# Patient Record
Sex: Female | Born: 1960 | Race: White | Hispanic: No | Marital: Married | Smoking: Former smoker
Health system: Southern US, Community
[De-identification: ages and names within clinical notes are randomized; demographics above are authoritative.]

## PROBLEM LIST (undated history)

## (undated) DIAGNOSIS — E669 Obesity, unspecified: Secondary | ICD-10-CM

## (undated) DIAGNOSIS — E079 Disorder of thyroid, unspecified: Secondary | ICD-10-CM

## (undated) DIAGNOSIS — D219 Benign neoplasm of connective and other soft tissue, unspecified: Secondary | ICD-10-CM

## (undated) DIAGNOSIS — G43909 Migraine, unspecified, not intractable, without status migrainosus: Secondary | ICD-10-CM

## (undated) DIAGNOSIS — N809 Endometriosis, unspecified: Secondary | ICD-10-CM

## (undated) DIAGNOSIS — M1990 Unspecified osteoarthritis, unspecified site: Secondary | ICD-10-CM

## (undated) DIAGNOSIS — E785 Hyperlipidemia, unspecified: Secondary | ICD-10-CM

## (undated) HISTORY — DX: Benign neoplasm of connective and other soft tissue, unspecified: D21.9

## (undated) HISTORY — DX: Disorder of thyroid, unspecified: E07.9

## (undated) HISTORY — DX: Endometriosis, unspecified: N80.9

## (undated) HISTORY — PX: TOTAL KNEE ARTHROPLASTY: SHX125

## (undated) HISTORY — PX: OTHER SURGICAL HISTORY: SHX169

## (undated) HISTORY — DX: Obesity, unspecified: E66.9

## (undated) HISTORY — DX: Hyperlipidemia, unspecified: E78.5

## (undated) HISTORY — DX: Migraine, unspecified, not intractable, without status migrainosus: G43.909

## (undated) HISTORY — DX: Unspecified osteoarthritis, unspecified site: M19.90

---

## 1999-03-25 ENCOUNTER — Other Ambulatory Visit: Admission: RE | Admit: 1999-03-25 | Discharge: 1999-03-25 | Payer: Self-pay | Admitting: Obstetrics and Gynecology

## 1999-09-07 ENCOUNTER — Emergency Department (HOSPITAL_COMMUNITY): Admission: EM | Admit: 1999-09-07 | Discharge: 1999-09-07 | Payer: Self-pay | Admitting: Emergency Medicine

## 1999-09-07 ENCOUNTER — Encounter: Payer: Self-pay | Admitting: Emergency Medicine

## 1999-09-13 ENCOUNTER — Ambulatory Visit (HOSPITAL_BASED_OUTPATIENT_CLINIC_OR_DEPARTMENT_OTHER): Admission: RE | Admit: 1999-09-13 | Discharge: 1999-09-13 | Payer: Self-pay | Admitting: Otolaryngology

## 2000-03-20 ENCOUNTER — Other Ambulatory Visit: Admission: RE | Admit: 2000-03-20 | Discharge: 2000-03-20 | Payer: Self-pay | Admitting: Obstetrics and Gynecology

## 2003-02-05 ENCOUNTER — Other Ambulatory Visit: Admission: RE | Admit: 2003-02-05 | Discharge: 2003-02-05 | Payer: Self-pay | Admitting: Obstetrics and Gynecology

## 2004-04-28 ENCOUNTER — Other Ambulatory Visit: Admission: RE | Admit: 2004-04-28 | Discharge: 2004-04-28 | Payer: Self-pay | Admitting: Obstetrics and Gynecology

## 2005-05-24 ENCOUNTER — Other Ambulatory Visit: Admission: RE | Admit: 2005-05-24 | Discharge: 2005-05-24 | Payer: Self-pay | Admitting: Obstetrics and Gynecology

## 2006-06-27 ENCOUNTER — Encounter: Admission: RE | Admit: 2006-06-27 | Discharge: 2006-06-27 | Payer: Self-pay | Admitting: Obstetrics and Gynecology

## 2006-07-31 HISTORY — PX: ENDOMETRIAL ABLATION: SHX621

## 2007-01-25 ENCOUNTER — Encounter: Admission: RE | Admit: 2007-01-25 | Discharge: 2007-01-25 | Payer: Self-pay | Admitting: Obstetrics and Gynecology

## 2007-06-14 ENCOUNTER — Encounter (HOSPITAL_COMMUNITY): Payer: Self-pay | Admitting: Obstetrics and Gynecology

## 2007-06-14 ENCOUNTER — Ambulatory Visit (HOSPITAL_COMMUNITY): Admission: RE | Admit: 2007-06-14 | Discharge: 2007-06-14 | Payer: Self-pay | Admitting: Obstetrics and Gynecology

## 2007-07-02 ENCOUNTER — Encounter: Admission: RE | Admit: 2007-07-02 | Discharge: 2007-07-02 | Payer: Self-pay | Admitting: Obstetrics and Gynecology

## 2007-12-18 ENCOUNTER — Encounter: Admission: RE | Admit: 2007-12-18 | Discharge: 2007-12-18 | Payer: Self-pay | Admitting: Internal Medicine

## 2008-07-02 ENCOUNTER — Encounter: Admission: RE | Admit: 2008-07-02 | Discharge: 2008-07-02 | Payer: Self-pay | Admitting: Obstetrics and Gynecology

## 2008-07-31 HISTORY — PX: CHOLECYSTECTOMY: SHX55

## 2008-07-31 HISTORY — PX: CHOLECYSTECTOMY, LAPAROSCOPIC: SHX56

## 2008-07-31 HISTORY — PX: APPENDECTOMY: SHX54

## 2009-01-25 ENCOUNTER — Emergency Department (HOSPITAL_COMMUNITY): Admission: EM | Admit: 2009-01-25 | Discharge: 2009-01-26 | Payer: Self-pay | Admitting: Emergency Medicine

## 2009-01-28 ENCOUNTER — Inpatient Hospital Stay (HOSPITAL_COMMUNITY): Admission: RE | Admit: 2009-01-28 | Discharge: 2009-01-30 | Payer: Self-pay | Admitting: General Surgery

## 2009-01-28 ENCOUNTER — Encounter (INDEPENDENT_AMBULATORY_CARE_PROVIDER_SITE_OTHER): Payer: Self-pay | Admitting: General Surgery

## 2009-03-15 ENCOUNTER — Encounter: Admission: RE | Admit: 2009-03-15 | Discharge: 2009-03-15 | Payer: Self-pay | Admitting: General Surgery

## 2009-08-04 ENCOUNTER — Encounter: Admission: RE | Admit: 2009-08-04 | Discharge: 2009-08-04 | Payer: Self-pay | Admitting: Obstetrics and Gynecology

## 2010-08-10 ENCOUNTER — Encounter
Admission: RE | Admit: 2010-08-10 | Discharge: 2010-08-29 | Payer: Self-pay | Source: Home / Self Care | Attending: Orthopedic Surgery | Admitting: Orthopedic Surgery

## 2010-08-22 ENCOUNTER — Other Ambulatory Visit (HOSPITAL_COMMUNITY): Payer: Self-pay | Admitting: Obstetrics and Gynecology

## 2010-08-22 DIAGNOSIS — Z1239 Encounter for other screening for malignant neoplasm of breast: Secondary | ICD-10-CM

## 2010-09-06 ENCOUNTER — Ambulatory Visit
Admission: RE | Admit: 2010-09-06 | Discharge: 2010-09-06 | Disposition: A | Discharge status: Home / Self Care | Payer: BC Managed Care – PPO | Source: Ambulatory Visit | Attending: Obstetrics and Gynecology | Admitting: Obstetrics and Gynecology

## 2010-09-06 DIAGNOSIS — Z1239 Encounter for other screening for malignant neoplasm of breast: Secondary | ICD-10-CM

## 2010-11-07 LAB — COMPREHENSIVE METABOLIC PANEL
Albumin: 4.3 g/dL (ref 3.5–5.2)
Alkaline Phosphatase: 71 U/L (ref 39–117)
BUN: 9 mg/dL (ref 6–23)
Calcium: 9.4 mg/dL (ref 8.4–10.5)
Creatinine, Ser: 0.65 mg/dL (ref 0.4–1.2)
Glucose, Bld: 79 mg/dL (ref 70–99)
Potassium: 3.6 mEq/L (ref 3.5–5.1)
Total Protein: 7.1 g/dL (ref 6.0–8.3)

## 2010-11-07 LAB — DIFFERENTIAL
Basophils Relative: 0 % (ref 0–1)
Lymphocytes Relative: 27 % (ref 12–46)
Lymphs Abs: 2.4 10*3/uL (ref 0.7–4.0)
Monocytes Absolute: 0.5 10*3/uL (ref 0.1–1.0)
Monocytes Relative: 6 % (ref 3–12)
Neutro Abs: 5.8 10*3/uL (ref 1.7–7.7)
Neutrophils Relative %: 65 % (ref 43–77)

## 2010-11-07 LAB — CBC
HCT: 40 % (ref 36.0–46.0)
Hemoglobin: 12.7 g/dL (ref 12.0–15.0)
Hemoglobin: 14 g/dL (ref 12.0–15.0)
MCHC: 35.1 g/dL (ref 30.0–36.0)
Platelets: 196 10*3/uL (ref 150–400)
RBC: 3.89 MIL/uL (ref 3.87–5.11)
RDW: 13.2 % (ref 11.5–15.5)
RDW: 13.6 % (ref 11.5–15.5)

## 2010-11-07 LAB — URINALYSIS, ROUTINE W REFLEX MICROSCOPIC
Glucose, UA: NEGATIVE mg/dL
Specific Gravity, Urine: 1.017 (ref 1.005–1.030)
Urobilinogen, UA: 0.2 mg/dL (ref 0.0–1.0)

## 2010-11-07 LAB — LIPASE, BLOOD: Lipase: 16 U/L (ref 11–59)

## 2010-11-07 LAB — URINE MICROSCOPIC-ADD ON

## 2010-12-13 NOTE — Op Note (Signed)
Tara Schroeder, Tara Schroeder            ACCOUNT NO.:  [id number]   MEDICAL RECORD NO.:  [id number]          PATIENT TYPE:  AMB   LOCATION:  SDC                           FACILITY:  [facility]   PHYSICIAN:  Zelphia Cairo, MD    DATE OF BIRTH:  [birth year]   DATE OF PROCEDURE:  06/14/2007  DATE OF DISCHARGE:                               OPERATIVE REPORT   PREOPERATIVE DIAGNOSIS:  Menorrhagia, suspect endometrial polyp.   POSTOPERATIVE DIAGNOSIS:  Menorrhagia, suspect endometrial polyp,  pathology pending.   PROCEDURE:  Hysteroscopy, dilatation and curettage, NovaSure ablation,  cervical block.   SURGEON:  Zelphia Cairo, M.D.   ANESTHESIA:  General.   SPECIMEN:  Endometrial curettage sent to pathology.   ESTIMATED BLOOD LOSS:  Minimal.   COMPLICATIONS:  None.   FLUID DEFICIT:  50 mL.   CONDITION:  Stable and extubated to the recovery room.   PROCEDURE IN DETAIL:  The patient was taken to the operating room where  general anesthesia was obtained. She was placed in the dorsal lithotomy  position using Allen stirrups.  She was prepped and draped in a sterile  fashion.  A catheter was used to drain her bladder for clear urine.  A  bivalve speculum was then placed in the vagina and a single tooth  tenaculum on the anterior lip of the cervix.  The uterine length sounded  to 10 cm.  The cervix sounded to 4 cm.  A diagnostic hysteroscope was  inserted into the endometrial cavity and a survey of the uterine cavity  was performed.  Bilateral ostia were visualized and appeared normal.  There were no masses or polyps noted.  Shaggy endometrium was observed.  The hysteroscope was then removed.  A D&C was performed.  The specimen  was placed on Telfa, passed off, and sent to pathology.   The NovaSure device was then inserted into the uterine cavity.  The  cavity width was noted to be 3.8.  NovaSure ablation was then performed  for 1 minute 34 seconds.  The NovaSure was removed.  A cervical  block  was performed using 1% lidocaine.  The single tooth tenaculum and  speculum were removed and the patient was taken to the recovery room in  stable condition.      Zelphia Cairo, MD  Electronically Signed     GA/MEDQ  D:  06/14/2007  T:  06/14/2007  Job:  [id number]

## 2010-12-13 NOTE — H&P (Signed)
NAMEADISEN, Tara Schroeder            ACCOUNT NO.:  [id number]   MEDICAL RECORD NO.:  [id number]          PATIENT TYPE:  AMB   LOCATION:                                FACILITY:  [facility]   PHYSICIAN:  Zelphia Cairo, MD    DATE OF BIRTH:  1960/12/18   DATE OF ADMISSION:  06/14/2007  DATE OF DISCHARGE:  06/14/2007                              HISTORY & PHYSICAL   A 50 year old G2, P2 white female who presented to the office in  February, 2007 for an annual exam with complaints of irregular heavy  menstrual cycles.  She underwent a sonohystogram in February, 2008  showing a 9 mm polypoid mass in the lower uterine segment.   PAST MEDICAL HISTORY:  Otherwise negative.   PAST SURGICAL HISTORY:  Negative.   She is a nonsmoker.   ALLERGIES:  TARAMYCIN, SULFA.   MEDICATIONS:  None.   GYN HISTORY:  Husband had a vasectomy for contraception.  Her last  menstrual period was the last week in October.  Pap smear performed in  September, 2008 was normal.   FAMILY HISTORY:  Significant for a father with a history of a heart  attack and high blood pressure.   OB HISTORY:  Significant for two vaginal deliveries, one set of twins.   PHYSICAL EXAMINATION:  Height 5 feet 7.  Weight 213.  Blood pressure  120/80, hemoglobin 12.1.  HEAD/NECK:  Normal.  No thyromegaly, nodularity.  LUNGS:  Clear.  BREASTS:  Symmetrical.  No palpable masses or lesions.  ABDOMEN:  Soft and nontender.  Obese.  No masses noted.  PELVIC:  Normal external female genitalia.  Urethral meatus, vagina, and  cervix are normal with no lesions.  Uterus is mobile and nontender.  No  adnexal masses are noted.   Pelvic ultrasound showed a normal-appearing uterus with a thickened  anterior myometrium.  Bilateral ovaries appeared normal.  After saline  infusion, she is noted to have a 9 mm polypoid mass.   A 50 year old white female with irregular menses and endometrial polyps.  Plan for hysteroscopy and D&C with NovaSure  ablation.      Zelphia Cairo, MD  Electronically Signed     GA/MEDQ  D:  06/13/2007  T:  06/13/2007  Job:  [id number]

## 2010-12-13 NOTE — Op Note (Signed)
NAMECAMRYNN, Tara Schroeder            ACCOUNT NO.:  [id number]   MEDICAL RECORD NO.:  [id number]          PATIENT TYPE:  OIB   LOCATION:  1532                         FACILITY:  [facility]   PHYSICIAN:  Anselm Pancoast. Weatherly, M.D.DATE OF BIRTH:  1961/07/28   DATE OF PROCEDURE:  01/28/2009  DATE OF DISCHARGE:                               OPERATIVE REPORT   PREOPERATIVE DIAGNOSIS:  Chronic cholecystitis with stones.   POSTOPERATIVE DIAGNOSES:  1. Chronic cholecystitis with stones.  2. Chronic appendicitis.   OPERATIONS:  1. Laparoscopic cholecystectomy with cholangiogram.  2. Laparoscopic attempt at appendectomy.   I made a little small midline incision and did the completion  appendectomy open.   SURGEON:  Anselm Pancoast. Zachery Dakins, M.D.   ASSISTANT:  Ardeth Sportsman, MD   ANESTHESIA:  General anesthesia.   HISTORY:  Tara Schroeder is a 50 year old female who, for  approximately 2 weeks, has had vague right-sided abdominal pain.  She  had a visit with Dr. Elmyra Ricks PA in [facility] approximately 10  days ago, and had laboratory studies performed.  Her white count was  normal and she was started on Aciphex, thinking that this was peptic  ulcer in origin.  The patient has known she has had gallstones, but not  sure what the episode of pain; but about a year ago she had an  ultrasound.  Then, on Tuesday evening, the pain became more severe.  She  went to the emergency room at [facility] and was seen by the ER  physician.  They repeated laboratory studies and her white count was not  elevated.  They did an ultrasound that showed she definitely had a lot  of stones in her gallbladder, and suggested that she be seen in our  office for symptomatic gallstones.   I saw her in the urgent office yesterday, and on examination her pain is  more to the right at the umbilicus; not truly right lower quadrant, but  not as high in the right upper abdomen.  She said she has lost about  50  pounds with diet management over the last year.  I have recommended we  go ahead and add her onto the OR schedule for today.  I did not repeat  any additional labs, but discussed with her that I would look in the  lower abdomen since I was concerned about that this could possibly be  atypical presentation of appendicitis.  She said that there are 3 family  members, and she has got twins and both of them presented with a  ruptured appendicitis.  The last one was about a year ago.   The patient preoperatively was given 3 grams of Unasyn.  She had PAS  stockings and was taken to the operative suite.  Induction of general  anesthesia via endotracheal tube, oral tube to the stomach, and then the  abdomen was prepped with Betadine surgical scrub solution and draped in  a sterile manner.  I made a small incision below the umbilicus.  She had  about a 2 cm getting down to the fascia, and made a little opening  and  picked up at the fascial edges with Kocher's; and then carefully entered  into the peritoneal cavity.  An 0 Vicryl was used for a pursestring, and  a Hassan cannula.   The gallbladder was chronically distended and thickened, a lot of  adhesions around it.  I made the upper 10 mL trocar in the subxiphoid  area, and the 2 lateral 5 mm trocars at the appropriate lateral  position.  The adhesions around to the omentum and all were taken down  carefully, getting down so we could see the most proximal portion of the  gallbladder.  Then, with the adhesions removed from that, we could  visualize the cystic duct and also the cystic artery.  I encompassed the  cystic duct with a right-angle; put a clip across the junction of the  cystic duct gallbladder, and then doubly clipped the cystic artery  proximally, singly and distally, but did not divide it at point.   A cholangiogram was obtained with a Cook catheter, and it shows good  prompt filling of the extrahepatic biliary system.  It  looks like the  cystic duct kind of rotates anteriorly on the common bile duct, and then  good flow into the duodenum.  I took the catheter out and then triply  clipped the cystic duct, divided it, and then divided the cystic artery  that had been previously clipped.  I then very carefully freed the quite  large gallbladder from its bed.  A lot of adhesions where she has had  previous episodes of pain.  The gallbladder was placed in an EndoCatch  bag after everything was removed.  We then switched the camera to the  upper 10 mL port, and grasped the bag containing the gallbladder and  withdrew it at the umbilicus.   I put the [facility] cannula back in, because I wanted to definitely  visualize the right lower quadrant; and I could see the cecum nicely  when we first had placed a camera in, but I wanted to put her head down  so I could visualize it more.  When I did this, I could see that we got  some type of chronic inflammatory process involving probably about the  distal foot of the small bowel; and I could see her appendix kind of  going over into the area.  It looks as if she has had kind of a chronic  appendicitis.  I could encompass the base of the appendix.  Dr. Michaell Cowing  was assisting; but really trying to visualize and free this up  completely, there was a couple of little gushes of what looks like old  hematoma-type fluid and not really an abscess.  I was just not  comfortable that we could manipulate this terminal small bowel, it  looked like it kind of Z-backed on itself; with the appendix kind of  caught within the folds of the area.  We had mobilized the right colon  so that we could bring it to the midline.  At this point, I opened up  the fascia midline right below the umbilicus, probably about a 4 cm  area; so I could visualize the cecum and the appendix, and brought it up  through the skin level.  With this up then, there was obviously a  chronically inflamed appendix with a  lot of peri-inflammatory action on  the distal small bowel.  There was no evidence of any mass-like Crohn's  disease and etc.  I went  ahead and freed up the appendiceal mesentery.  I was using the harmonic scalpel, and then we had already opened the  stapler, thinking I was going use a linear stapler if I could have got  it all laparoscopically.   The  inflamed appendix was withdrawn from the field.  Then we sort of  took down the adhesions on the terminal ileum so we could definitely  inspect it.  There was no evidence of obstruction, but a lot of  inflammatory action on one side of the bowel where the appendix had been  adherent.  I had a couple of little serosal in that areas that were  closed with Lembert sutures of 3-0 silk.  I then dropped the terminal  ileum back into the abdomen.   We irrigated, aspirated and withdrew the [employer]'s in the area.  I  then closed the fascia with interrupted sutures of 0 Prolene.  With this  done, we then put the CO2 back on, and visualized everything.  The area  of the inflamed small bowel looked fine.  We freed up also the  dissection, but definitely not bleeding whatever.  The omentum was  placed over it.  The irrigating fluid we had aspirated most of it, and  it looked like it was well irrigated with the laparoscopic aspirator.  We then removed the trocars.  I had placed an additional 5 mm trocar in  lateral, a little bit above the umbilicus; and we were trying to do it  laparoscopically using the typical laparoscopic ports.  I then closed  the skin with staples.   The patient tolerated the procedure nicely.  At the end of the case,  since we had worked about 2-1/2 hours, I did an in and out  catheterization; probably got about  500 mL of fluid.  She was then sent  to the recovery room.   I am going to keep her on antibiotics for probably about 24 hours  intravenously, and then probably oral antibiotics for a few days.  I  really think the  acute episodes of pain was this inflammatory process  with the appendix.  She certainly had been having episodes of  gallbladder pain; but I really leave retrospectively that the acute  episodes was this inflammatory process.   Through the laparoscope, it looks like her tubes and ovaries looked fine  when we were visualizing then.      Anselm Pancoast. Zachery Dakins, M.D.  Electronically Signed     WJW/MEDQ  D:  01/28/2009  T:  01/28/2009  Job:  [id number]   cc:   Massie Maroon, MD  Fax: [phone]

## 2010-12-13 NOTE — H&P (Signed)
NAMERAMSEY, MIDGETT            ACCOUNT NO.:  [id number]   MEDICAL RECORD NO.:  [id number]          PATIENT TYPE:  AMB   LOCATION:  DAY                          FACILITY:  [facility]   PHYSICIAN:  Anselm Pancoast. Weatherly, M.D.DATE OF BIRTH:  [birth year]   DATE OF ADMISSION:  01/28/2009  DATE OF DISCHARGE:                              HISTORY & PHYSICAL   CHIEF COMPLAINT:  Right-sided abdominal pain.   HISTORY:  Tara Schroeder is a 50 year old female who was referred to  me after being seen in the emergency room at [facility] on Tuesday  evening for right-sided abdominal pain.  She states that the pain  started several days earlier, kind of waxing and waning, has had  multiple episodes of pain and described the pain in the right side of  the abdomen.  The pain was not radiating, and I was surprised when I saw  her in the office in that she was referred over because of gallstones  that had been seen on the ultrasound.  She had, had an ultrasound about  a year ago that confirmed that she did have gallstones and also has been  dieting significantly, and I think has lost approximately 50 pounds.  I  examined her and she kind of localizes the pain to kind of the right of  the umbilicus and moving to the right upper quadrant, but her ultrasound  showed definitely and there were 14 pretty good-sized stones in the  gallbladder.  Her laboratory studies had shown normal liver function  studies and her white blood count was not significantly elevated.  She  was advised to be seen in our office from the [facility] emergency room and  was seen yesterday afternoon in the office.  Her past history is  significant in that she has got three children, twins that had ruptured  appendicitis, not at the same time, but in the last year or two and then  there is another family member that has had appendicitis.  I discussed  with her when I saw in the office that I would look in the appendix area  at the time of  surgery since the pain is a little atypical for the  straightforward gallbladder pain.  [facility], Dr. Selena Batten is her  regular medical physician and she had been seen by the nurse PA in his  office approximately a week ago at which time her white count was 7100,  and they did an amylase and lipase and they placed her on peptic ulcer  type medications because of this intermittent mid abdominal pain.   CHRONIC MEDICATIONS:  She is on multivitamins.  She is on Percocet for  the pain.  She is on Zofran 8 mg for nausea and a sample of an H2  blocker.   ALLERGIES:  SHE SAYS SHE IS ALLERGIC TO TERRAMYCIN, TETRACYCLINE AND I  THINK SULFA.   PHYSICAL EXAMINATION:  VITAL SIGNS.  Blood pressure 118/90, pulse 56,  temperature 98.2.  GENERAL:  She is a pleasant, slightly overweight female in no acute  distress.  LUNGS:  Clear.  CARDIAC:  Normal sinus rhythm.  ABDOMEN:  She is not acutely tender, but she king of localizing the pain  to the right umbilicus area and not really truly the right upper  quadrant.  She is not real tender in the right lower abdomen where I  thought the appendix would be, and I did not do a pelvic exam on her.  CNS:  Physiologic.  SKIN:  Unremarkable.   IMPRESSION:  Symptomatic gallstones with right-sided abdominal pain, a  little lower than the typical cholecystitis type pain.      Anselm Pancoast. Zachery Dakins, M.D.  Electronically Signed     WJW/MEDQ  D:  01/28/2009  T:  01/28/2009  Job:  [id number]

## 2010-12-16 NOTE — Discharge Summary (Signed)
[name], Schroeder            ACCOUNT NO.:  [id number]   MEDICAL RECORD NO.:  [id number]          PATIENT TYPE:  INP   LOCATION:  1532                         FACILITY:  [facility]   PHYSICIAN:  Anselm Pancoast. Weatherly, M.D.DATE OF BIRTH:  January 30, 1961   DATE OF ADMISSION:  01/28/2009  DATE OF DISCHARGE:  01/30/2009                               DISCHARGE SUMMARY   DISCHARGE DIAGNOSES:  1. Chronic cholecystitis with stones.  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   OPERATION:  Laparoscopic cholecystectomy and cholangiogram and  appendectomy, open, assisted. h   HISTORY:  [name] is a 50 year old female, referred to me  after being seen in the emergency room at [facility] on Tuesday  evening with the following history:  She states that for approximately  three to four weeks she has had pain in the right side of the abdomen,  kind of waxing and waning, with mild episodes.  It usually would occur  after eating.  The pain does not radiate to other areas, and she has  known that she has had gallstones for many years.  The pain has not  really been in the right upper quadrant of her abdomen.  When she went  to the ER they did a repeat ultrasound of the gallbladder that showed  that she had a definitely a lot of stones within kind of a thickened  gallbladder and thought this was biliary in origin.  She was advised to  see [place] in the office and was seen in the Urgent Office the following  day.  On examination she was kind of mildly tender, more to the right  and slightly below the umbilicus, and not really in the true right upper  quadrant like a typical cholecystitis.  She had a CBC that had not been  elevated, and she had many stones and her gallbladder.  They are all  fairly large.  I recommended that we go ahead and proceed with a  laparoscopic cholecystectomy, but I will look in the right lower  quadrant, because I  was concerned that the pain was actually lower than  the typical cholecystitis.  She had also seen her regular physician  approximately three weeks earlier and they had thought possibly that she  was having peptic ulcer-type symptoms, and had placed her on a high acid  blocker.   On physical exam she was kind of vaguely tender to the right of the  abdomen, and we did not repeat any additional lab studies. She was taken  to surgery and induction of generalized anesthesia with an endotracheal  tube.  The laparoscopic was inserted.  She had a thickened gallbladder  with many stones and a cholecystectomy was done.  An angiogram was  obtained and there was good flow to the duodenum.  On looking in the  right lower quadrant, after completing the appendectomy, and Dr. Michaell Cowing  was assisting, she had a definite mass in the distal terminal ileal  area.  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  We tried to free the area up with a laparoscopic, but  you really could not get good exposure of the bowel.  The terminal ileum  looked as if it had kind of been wrapped or folded around this inflamed  appendix, but there was no evidence of any acute appendicitis, but  actually purulence.  We opened the incision a little below the umbilicus  after we had freed up the attachments to the ileum and the appendix, and  then could bring the area up to the incision.  Then with the dissection,  we could free the area.  The appendix was removed.  A proximal portion  had been stapled and the small bowel looked like it was just kind of a  peri-inflammation, not that of active Crohn's or an actual small bowel  area.  After we had opened things up, no evidence of any obstruction.  I  really think that some of her symptoms were related to an independent  partial obstruction of the small bowel, as the true source of her  pain.   The patient postoperatively was continued on antibiotics.  The patient  was kept an additional day.  She  was kind of nauseous the first night  and started on liquids the following day, and then was released the  following morning, which was a Saturday, by, Dr. Luretha Murphy.   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   DISPOSITION:  The patient is to see [place] in the office for a follow-up  appointment in approximately one week.   DISCHARGE MEDICATIONS:  I did just discharge her on Augmentin for  approximately five days orally.  The patient also has Vicodin for pain.   She will call if she is having any nausea, vomiting or other symptoms.  The patient was tolerating liquids without problems at the time of her  discharge.   REGULAR PHYSICIAN:  Massie Maroon, M.D.      Anselm Pancoast. Zachery Dakins, M.D.  Electronically Signed     WJW/MEDQ  D:  02/18/2009  T:  02/18/2009  Job:  [id number]   cc:   Massie Maroon, MD  Fax: [phone]

## 2010-12-16 NOTE — Op Note (Signed)
[place]  Patient:    Tara Schroeder, Tara Schroeder                   MRN: [id number] Proc. Date: 09/13/99 Adm. Date:  [id number] Attending:  Carlean Purl CC:         Kristine Garbe. Ezzard Standing, M.D.                           Operative Report  PREOPERATIVE DIAGNOSIS:  Dysphagia with chronic foreign body sensation x 12 days.  POSTOPERATIVE DIAGNOSIS:  Dysphagia with chronic foreign body sensation x 12 days.  PROCEDURE:  Direct laryngoscopy, rigid esophagoscopy.  SURGEON:  Kristine Garbe. Ezzard Standing, M.D.  ANESTHESIA:  General endotracheal.  COMPLICATIONS:  None.  FINDINGS:  Normal hypopharyngeal, esophageal mucosa with no foreign body identified.  BRIEF CLINICAL NOTE:  Tara Schroeder is a 50 year old white female, who initially developed a foreign body sensation in her throat when eating cheese bits 12 days ago.  The next evening, she was eating fish and that weekend, the foreign body sensation became worse, where she developed a sharp pain in her throat, initially started on the right side and then moved lower and seemed to lodge lower in the  throat about the level of the cricoid cartilage or just inferior to this just above the suprasternal notch.  She has now had a persistent sensation there for over  week and insists that there is a foreign body or something causing the pain. She is having no airway problems and really no difficulty swallowing.  Although, she has this foreign body sensation when she swallows.  She was taken to the operating room at this time for direct laryngoscopy and an esophagoscopy.  The area of the foreign body sensation seems to be the esophageal inlet or upper cervical esophagus as the area of discomfort is just beneath the level of the cricoid cartilage.   DESCRIPTION OF PROCEDURE:  After adequate endotracheal anesthesia, first a direct laryngoscopy was performed.  Tara Schroeder had some small amount of  debris within the  tonsil crypts bilaterally, which was cleaned.  There was no foreign body noted within the tonsil fossae.  Palpation of tonsils were benign bilaterally.  Next, the base of the tongue was examined and again, the base of the tongue and ______ area was all clear.  Epiglottis was normal to evaluation.  Both piriform sinuses appeared clear with direct laryngoscopy down to the esophageal inlet. Following this, first the cervical esophagoscope was utilized and cervical esophagoscope as passed down to the cervical esophagus, the esophageal inlet and upper cervical esophagus all appeared benign.  There was no obvious evidence of trauma to the cervical esophagus.  There is no inflammation, no ulceration and no foreign body identified.  Following this, the longer esophagoscope was utilized to look further down the esophagus and again, the mucosa all appeared intact with no obvious foreign body.  Manual palpation of the base of tongue down to the epiglottis all was normal to palpation.  Again, following the above procedures, direct laryngoscopy was again performed and again, patient had a normal-appearing hypopharyngeal mucosa.  Likewise, direct laryngoscopy of the vocal cords, endolarynx was also benign in appearance.  IMPRESSION:  Foreign body sensation with a normal upper airway examination.  No  foreign body identified.  RECOMMENDATION:  She is discharged home later this morning on Tylenol for discomfort.  Will try her on course of antacid therapy with Prilosec  20 mg a day for two weeks.  If the sensation persists, will have her follow up in my office in 10 days to two weeks for recheck. DD:  09/13/99 TD:  09/13/99 Job: [id number]

## 2011-05-09 LAB — CBC
MCHC: 35
RDW: 15.2

## 2011-05-09 LAB — TYPE AND SCREEN
ABO/RH(D): A POS
Antibody Screen: NEGATIVE

## 2012-05-01 ENCOUNTER — Encounter: Payer: Self-pay | Admitting: Internal Medicine

## 2012-07-31 HISTORY — PX: COLONOSCOPY: SHX174

## 2012-11-20 ENCOUNTER — Encounter: Payer: Self-pay | Admitting: Diagnostic Neuroimaging

## 2012-11-20 ENCOUNTER — Ambulatory Visit (INDEPENDENT_AMBULATORY_CARE_PROVIDER_SITE_OTHER): Payer: BC Managed Care – PPO | Admitting: Diagnostic Neuroimaging

## 2012-11-20 VITALS — BP 121/78 | Temp 98.0°F | Ht 67.0 in | Wt 205.0 lb

## 2012-11-20 DIAGNOSIS — G5730 Lesion of lateral popliteal nerve, unspecified lower limb: Secondary | ICD-10-CM | POA: Insufficient documentation

## 2012-11-20 DIAGNOSIS — G5731 Lesion of lateral popliteal nerve, right lower limb: Secondary | ICD-10-CM

## 2012-11-20 NOTE — Progress Notes (Signed)
[facility]  PATIENT: Tara Schroeder DOB: 10-18-1960  REFERRING CLINICIAN: Kim HISTORY FROM: patient REASON FOR VISIT: new consult   HISTORICAL  CHIEF COMPLAINT:  Chief Complaint  Patient presents with  . Numbness    R lower leg    HISTORY OF PRESENT ILLNESS:   52 year old right-handed female with remote history of migraine headaches, here for evaluation of numbness and weakness of right leg.  11/16/2012, patient was outside laying down on the patio chair and fell asleep. She woke up one hour later and felt numbness on the right lower extremity from her knee down to her toes. She also felt weakness when pulling her foot up. She noted some red markings near the right lateral malleolus region which he thought may be a spider bite. No significant swelling. Symptoms gradually improved over the next few days. Today patient still has some numbness and mild weakness. She thinks she is rolled her ankle several times while walking. She denies any back pain, left lower extremity problems, opportunity problems. No neck pain, urinary or bowel incontinence. No vision changes or headaches.  REVIEW OF SYSTEMS: Full 14 system review of systems performed and notable only for fatigue aching muscles numbness weakness.  ALLERGIES: Allergies  Allergen Reactions  . Bee Venom   . Sulfa Antibiotics   . Terramycin (Oxytetracycline)   . Tetracyclines & Related     HOME MEDICATIONS: No outpatient prescriptions prior to visit.   No facility-administered medications prior to visit.    PAST MEDICAL HISTORY: Past Medical History  Diagnosis Date  . Hyperlipidemia   . Migraines   . Obesity   . Fibroids   . Endometriosis     PAST SURGICAL HISTORY: Past Surgical History  Procedure Laterality Date  . Cholecystectomy, laparoscopic  2010  . Uterine ablation    . Total knee arthroplasty      Right  . Appendectomy  2010  . Colonoscopy  2014, 2006  . Endometrial ablation   2008    FAMILY HISTORY: Family History  Problem Relation Age of Onset  . Multiple myeloma Mother   . High blood pressure Father   . Heart disease Father   . Diabetes Father     SOCIAL HISTORY:  History   Social History  . Marital Status: Married    Spouse Name: N/A    Number of Children: 3  . Years of Education: MA   Occupational History  .     Social History Main Topics  . Smoking status: Former Smoker    Quit date: 08/01/2011  . Smokeless tobacco: Not on file     Comment: was an occasional smoker  . Alcohol Use: Yes     Comment: once a week  . Drug Use: No  . Sexually Active: Not on file   Other Topics Concern  . Not on file   Social History Narrative   Pt lives at home with her spouse.   Caffeine Use- 3 cups daily.     PHYSICAL EXAM  Filed Vitals:   11/20/12 0951  BP: 121/78  Temp: 98 F (36.7 C)  TempSrc: Oral  Height: 5\' 7"  (1.702 m)  Weight: 205 lb (92.987 kg)   Body mass index is 32.1 kg/(m^2).  GENERAL EXAM: Patient is in no distress; STRAIGHT LEG RAISE, FABER TESTING NEG.  CARDIOVASCULAR: Regular rate and rhythm, no murmurs, no carotid bruits  NEUROLOGIC: MENTAL STATUS: awake, alert, language fluent, comprehension intact, naming intact CRANIAL NERVE: no papilledema on fundoscopic  exam, pupils equal and reactive to light, visual fields full to confrontation, extraocular muscles intact, no nystagmus, facial sensation and strength symmetric, uvula midline, shoulder shrug symmetric, tongue midline. MOTOR: normal bulk and tone, full strength in the BUE, LLE (RLE DF 4/5, EVERSION 4/5, INVERSION 5/5. EHL 4/4). SENSORY: DECR TO PP IN RIGHT COMMON PERONEAL NERVE DISTRIBUTION COORDINATION: finger-nose-finger, fine finger movements normal REFLEXES: deep tendon reflexes present and symmetric GAIT/STATION: narrow based gait; able to walk on toes, DIFF WITH RIGHT HEEL WALKING. romberg is negative   DIAGNOSTIC DATA (LABS, IMAGING, TESTING) - I  reviewed patient records, labs, notes, testing and imaging myself where available.  Lab Results  Component Value Date   WBC 10.5 01/29/2009   HGB 12.7 01/29/2009   HCT 36.6 01/29/2009   MCV 94.1 01/29/2009   PLT 180 01/29/2009      Component Value Date/Time   NA 140 01/25/2009 2138   K 3.6 01/25/2009 2138   CL 106 01/25/2009 2138   CO2 23 01/25/2009 2138   GLUCOSE 79 01/25/2009 2138   BUN 9 01/25/2009 2138   CREATININE 0.65 01/25/2009 2138   CALCIUM 9.4 01/25/2009 2138   PROT 7.1 01/25/2009 2138   ALBUMIN 4.3 01/25/2009 2138   AST 24 01/25/2009 2138   ALT 20 01/25/2009 2138   ALKPHOS 71 01/25/2009 2138   BILITOT 0.7 01/25/2009 2138   GFRNONAA >60 01/25/2009 2138   GFRAA  Value: >60        The eGFR has been calculated using the MDRD equation. This calculation has not been validated in all clinical situations. eGFR's persistently <60 mL/min signify possible Chronic Kidney Disease. 01/25/2009 2138   No results found for this basename: CHOL, HDL, LDLCALC, LDLDIRECT, TRIG, CHOLHDL   No results found for this basename: HGBA1C   No results found for this basename: VITAMINB12   No results found for this basename: TSH    ASSESSMENT AND PLAN  52 y.o. year old female  has a past medical history of Hyperlipidemia; Migraines; Obesity; Fibroids; and Endometriosis. here with new-onset right lower extremity numbness and weakness. 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 Symptoms are rapidly improving. I recommend physical therapy, stretching and strengthening exercises. She may resume her physical activity as tolerated. If symptoms do not continue to improve over the next 1to 2 months, I may consider EMG/nerve conduction study and additional testing.   Orders Placed This Encounter  Procedures  . Ambulatory referral to Physical Therapy     Suanne Marker, MD 11/20/2012, 10:17 AM Certified in Neurology, Neurophysiology and  Neuroimaging  [facility] [address] [place], [place] [address] [phone]

## 2012-11-20 NOTE — Patient Instructions (Signed)
Physical therapy; gradually increase/resume activities

## 2014-04-14 ENCOUNTER — Other Ambulatory Visit: Payer: Self-pay | Admitting: Obstetrics and Gynecology

## 2014-04-15 LAB — CYTOLOGY - PAP

## 2014-06-22 ENCOUNTER — Emergency Department (HOSPITAL_COMMUNITY)
Admission: EM | Admit: 2014-06-22 | Discharge: 2014-06-22 | Disposition: A | Discharge status: Home / Self Care | Payer: BC Managed Care – PPO | Attending: Emergency Medicine | Admitting: Emergency Medicine

## 2014-06-22 ENCOUNTER — Encounter (HOSPITAL_COMMUNITY): Payer: Self-pay | Admitting: Emergency Medicine

## 2014-06-22 DIAGNOSIS — Z79899 Other long term (current) drug therapy: Secondary | ICD-10-CM | POA: Insufficient documentation

## 2014-06-22 DIAGNOSIS — M62838 Other muscle spasm: Secondary | ICD-10-CM

## 2014-06-22 DIAGNOSIS — E669 Obesity, unspecified: Secondary | ICD-10-CM | POA: Insufficient documentation

## 2014-06-22 DIAGNOSIS — Z87891 Personal history of nicotine dependence: Secondary | ICD-10-CM | POA: Insufficient documentation

## 2014-06-22 DIAGNOSIS — Z8742 Personal history of other diseases of the female genital tract: Secondary | ICD-10-CM | POA: Insufficient documentation

## 2014-06-22 DIAGNOSIS — M542 Cervicalgia: Secondary | ICD-10-CM | POA: Diagnosis present

## 2014-06-22 DIAGNOSIS — Z8679 Personal history of other diseases of the circulatory system: Secondary | ICD-10-CM | POA: Diagnosis not present

## 2014-06-22 DIAGNOSIS — E785 Hyperlipidemia, unspecified: Secondary | ICD-10-CM | POA: Insufficient documentation

## 2014-06-22 MED ORDER — KETOROLAC TROMETHAMINE 30 MG/ML IJ SOLN
30.0000 mg | Freq: Once | INTRAMUSCULAR | Status: AC
  Administered 2014-06-22: 30 mg via INTRAMUSCULAR
  Filled 2014-06-22: qty 1

## 2014-06-22 MED ORDER — MELOXICAM 7.5 MG PO TABS: 7.5000 mg | ORAL_TABLET | Freq: Every day | ORAL | Status: DC

## 2014-06-22 MED ORDER — OXYCODONE-ACETAMINOPHEN 5-325 MG PO TABS: 2.0000 | ORAL_TABLET | ORAL | Status: DC | PRN

## 2014-06-22 MED ORDER — OXYCODONE-ACETAMINOPHEN 5-325 MG PO TABS
1.0000 | ORAL_TABLET | Freq: Once | ORAL | Status: AC
  Administered 2014-06-22: 1 via ORAL
  Filled 2014-06-22: qty 1

## 2014-06-22 MED ORDER — DIAZEPAM 5 MG PO TABS
5.0000 mg | ORAL_TABLET | Freq: Once | ORAL | Status: AC
  Administered 2014-06-22: 5 mg via ORAL
  Filled 2014-06-22: qty 1

## 2014-06-22 MED ORDER — DIAZEPAM 5 MG PO TABS
5.0000 mg | ORAL_TABLET | Freq: Two times a day (BID) | ORAL | Status: DC
Start: 2014-06-22 — End: 2014-07-10

## 2014-06-22 NOTE — ED Notes (Signed)
Pt presents with neck pain that has been present for the past week- denies injury to neck.  Pt was seen at [facility] on Saturday and dx with Cervicalgia, pt sent home with pain medications that she states are not helping.  Pt denies numbness or tingling, c-collar applied in triage.

## 2014-06-22 NOTE — ED Provider Notes (Signed)
CSN: [id number]     Arrival date & time 06/22/14  1645 History   First MD Initiated Contact with Patient 06/22/14 1700     Chief Complaint  Patient presents with  . Neck Pain   HPI  Patient is a 53 year old female who presents with left-sided neck pain 1 week. Patient states that approximately one week ago she began having pain behind her left ear near her mastoid process. Over the week her pain gradually progressed and radiated down her left side of her neck towards her chest. She states originally it felt like a aching soreness like sleeping wrong. She states that on Thursday morning her pain got significantly worse. On Saturday she can no longer take the pain and went to an urgent care where she was given a shot of Toradol, hydrocodone, and Robaxin. She states that this initially helped and brought her pain which was a 9 out of 10 down to a 4 out of 10. She states that now the pain medication and muscle relaxant are barely taking the edge off of her pain. Her last dose of pain medication was at 1:30 PM. Patient states that as her medication starts to wear off she has some soreness with swallowing. She has no sore throat. She has been able to eat and drink and tolerate her own secretions well. Patient is currently taking Chantix for smoking cessation. 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 She had a retained MRI performed by Dr. Sherwood Gambler previously, but patient states that Dr. Sherwood Gambler did not feel that surgery was necessary at that time. Patient denies any injury to the neck. She denies any loss of balance, saddle anesthesias, loss of bowel or bladder, history of osteoporosis or frequent fracture, IV drug use, history of cancer. She has never had any surgery on her neck. Patient does note occasional left-sided tingling and numbness down her arm. She states that this got much worse when they put her in a c-collar in triage today.  Past  Medical History  Diagnosis Date  . Hyperlipidemia   . Migraines   . Obesity   . Fibroids   . Endometriosis    Past Surgical History  Procedure Laterality Date  . Cholecystectomy, laparoscopic  2010  . Uterine ablation    . Total knee arthroplasty      Right  . Appendectomy  2010  . Colonoscopy  2014, 2006  . Endometrial ablation  2008   Family History  Problem Relation Age of Onset  . Multiple myeloma Mother   . High blood pressure Father   . Heart disease Father   . Diabetes Father    History  Substance Use Topics  . Smoking status: Former Smoker    Quit date: 08/01/2011  . Smokeless tobacco: Not on file     Comment: was an occasional smoker  . Alcohol Use: Yes     Comment: once a week   OB History    No data available     Review of Systems  Constitutional: Negative for fever, chills and fatigue.  HENT: Negative for congestion, drooling, facial swelling, postnasal drip, sore throat and trouble swallowing.   Respiratory: Negative for cough, chest tightness, shortness of breath and wheezing.   Cardiovascular: Negative for chest pain and palpitations.  Gastrointestinal: Negative for nausea, vomiting, diarrhea, constipation and blood in stool.  Musculoskeletal: Positive for neck pain. Negative for myalgias, back pain, joint swelling, gait problem and neck stiffness.  All other systems reviewed and are negative.     Allergies  Bee venom; Sulfa antibiotics; Terramycin; and Tetracyclines & related  Home Medications   Prior to Admission medications   Medication Sig Start Date End Date Taking? Authorizing Provider  Calcium Carbonate-Vitamin D (TGT CALCIUM DIETARY SUPPLEMENT PO) Take by mouth.   Yes Historical Provider, MD  ketorolac (TORADOL) 60 MG/2ML SOLN injection Inject 60 mg into the muscle once.   Yes Historical Provider, MD  polyethylene glycol (MIRALAX / GLYCOLAX) packet Take 17 g by mouth 3 (three) times a week.   Yes Historical Provider, MD  varenicline  (CHANTIX) 1 MG tablet Take 1 mg by mouth 2 (two) times daily.   Yes Historical Provider, MD  diazepam (VALIUM) 5 MG tablet Take 1 tablet (5 mg total) by mouth 2 (two) times daily. 06/22/14   Marabeth Melland A Forcucci, PA-C  meloxicam (MOBIC) 7.5 MG tablet Take 1 tablet (7.5 mg total) by mouth daily. 06/22/14   Shereece Wellborn A Forcucci, PA-C  oxyCODONE-acetaminophen (PERCOCET) 5-325 MG per tablet Take 2 tablets by mouth every 4 (four) hours as needed. 06/22/14   Sharronda Schweers A Forcucci, PA-C   BP 132/84 mmHg  Pulse 70  Temp(Src) 97.6 F (36.4 C) (Oral)  Resp 16  Ht $R'5\' 7"'Sa$  (1.702 m)  Wt 192 lb (87.091 kg)  BMI 30.06 kg/m2  SpO2 98% Physical Exam  Constitutional: She is oriented to person, place, and time. She appears well-developed and well-nourished. No distress.  HENT:  Head: Normocephalic.  Mouth/Throat: Oropharynx is clear and moist. No oropharyngeal exudate.  Eyes: Conjunctivae and EOM are normal. Pupils are equal, round, and reactive to light. No scleral icterus.  Neck: Neck supple. No JVD present. No thyromegaly present.  Cardiovascular: Normal rate, regular rhythm, normal heart sounds and intact distal pulses.  Exam reveals no gallop and no friction rub.   No murmur heard. Pulmonary/Chest: Effort normal and breath sounds normal. No respiratory distress. She has no wheezes. She has no rales. She exhibits no tenderness.  Musculoskeletal:  Patient rises slowly from sitting to standing.  They walk without an antalgic gait.  There is no evidence of erythema, ecchymosis, or gross deformity.  There is tenderness to palpation over left mastoid process, left sternocleidomastoid muscle. There is no tenderness to palpation over the cervical spine, thoracic spine, or lumbar spine.  Active ROM with neck flexion is full. There is limited rotation and lateral bending of the cervical spine. There is minimal neck extension due to pain.  Lymphadenopathy:    She has no cervical adenopathy.  Neurological: She is alert  and oriented to person, place, and time. She has normal strength. No cranial nerve deficit or sensory deficit. Coordination normal.  Skin: Skin is warm and dry. She is not diaphoretic.  Psychiatric: She has a normal mood and affect. Her behavior is normal. Judgment and thought content normal.  Nursing note and vitals reviewed.   ED Course  Procedures (including critical care time) Labs Review Labs Reviewed - No data to display  Imaging Review No results found.   EKG Interpretation None      MDM   Final diagnoses:  Cervicalgia  Neck muscle spasm   Patient is a 53 year old female who presents to the emergency department with left-sided neck pain 1 week. Physical exam reveals no focal neurological deficits. There are no signs of meningismus. There is no tenderness to the bony spine. There is tenderness to palpation over the left sternocleidomastoid muscle, and limited range of motion.  I do not feel that plain film imaging is required at this time due to no bony tenderness. Patient was given oxycodone, and Valium here in the ED with some relief of symptoms. Patient was also given an IM injection of Toradol. Suspect that patient needs stronger pain medication and will change muscle relaxer at home. Differential includes muscle strain versus muscle spasm versus flare of degenerative disc disease. Patient is to follow-up with Dr. Sherwood Gambler in 1 week if symptoms have not improved. Patient states understanding and agreement. Patient was told to return to the ED for loss of bowel or bladder, loss of balance, saddle anesthesias, or any other concerning symptoms. Patient states understanding and agreement at this time. Patient is stable for discharge. Patient has been discussed with Dr. Vanita Panda who agrees with the above plan and workup.    Cherylann Parr, PA-C 06/22/14 1906  Carmin Muskrat, MD 06/23/14 0030

## 2014-06-22 NOTE — Discharge Instructions (Signed)
Muscle Cramps and Spasms Muscle cramps and spasms are when muscles tighten by themselves. They usually get better within minutes. Muscle cramps are painful. They are usually stronger and last longer than muscle spasms. Muscle spasms may or may not be painful. They can last a few seconds or much longer. HOME CARE  Drink enough fluid to keep your pee (urine) clear or pale yellow.  Massage, stretch, and relax the muscle.  Use a warm towel, heating pad, or warm shower water on tight muscles.  Place ice on the muscle if it is tender or in pain.  Put ice in a plastic bag.  Place a towel between your skin and the bag.  Leave the ice on for 15-20 minutes, 03-04 times a day.  Only take medicine as told by your doctor. GET HELP RIGHT AWAY IF:  Your cramps or spasms get worse, happen more often, or do not get better with time. MAKE SURE YOU:  Understand these instructions.  Will watch your condition.  Will get help right away if you are not doing well or get worse. Document Released: 06/29/2008 Document Revised: 11/11/2012 Document Reviewed: 07/03/2012 [facility] Patient Information 2015 [place], [place]. This information is not intended to replace advice given to you by your health care provider. Make sure you discuss any questions you have with your health care provider.  Muscle Strain A muscle strain is an injury that occurs when a muscle is stretched beyond its normal length. Usually a small number of muscle fibers are torn when this happens. Muscle strain is rated in degrees. First-degree strains have the least amount of muscle fiber tearing and pain. Second-degree and third-degree strains have increasingly more tearing and pain.  Usually, recovery from muscle strain takes 1-2 weeks. Complete healing takes 5-6 weeks.  CAUSES  Muscle strain happens when a sudden, violent force placed on a muscle stretches it too far. This may occur with lifting, sports, or a fall.  RISK FACTORS Muscle  strain is especially common in athletes.  SIGNS AND SYMPTOMS At the site of the muscle strain, there may be:  Pain.  Bruising.  Swelling.  Difficulty using the muscle due to pain or lack of normal function. DIAGNOSIS  Your health care provider will perform a physical exam and ask about your medical history. TREATMENT  Often, the best treatment for a muscle strain is resting, icing, and applying cold compresses to the injured area.  HOME CARE INSTRUCTIONS   Use the PRICE method of treatment to promote muscle healing during the first 2-3 days after your injury. The PRICE method involves:  Protecting the muscle from being injured again.  Restricting your activity and resting the injured body part.  Icing your injury. To do this, put ice in a plastic bag. Place a towel between your skin and the bag. Then, apply the ice and leave it on from 15-20 minutes each hour. After the third day, switch to moist heat packs.  Apply compression to the injured area with a splint or elastic bandage. Be careful not to wrap it too tightly. This may interfere with blood circulation or increase swelling.  Elevate the injured body part above the level of your heart as often as you can.  Only take over-the-counter or prescription medicines for pain, discomfort, or fever as directed by your health care provider.  Warming up prior to exercise helps to prevent future muscle strains. SEEK MEDICAL CARE IF:   You have increasing pain or swelling in the injured area.  You have numbness, tingling, or a significant loss of strength in the injured area. MAKE SURE YOU:   Understand these instructions.  Will watch your condition.  Will get help right away if you are not doing well or get worse. Document Released: 07/17/2005 Document Revised: 05/07/2013 Document Reviewed: 02/13/2013 [facility] Patient Information 2015 [place], [place]. This information is not intended to replace advice given to you by your  health care provider. Make sure you discuss any questions you have with your health care provider.

## 2014-06-22 NOTE — ED Notes (Signed)
Pt c/o left sided neck pain since Friday. Was seen at [facility] on Saturday, given Robaxin and Norco prescriptions, as well as Toradol IM injection. Reports medication was helpful, pain has gotten progressively worse since Saturday. Denies injury or bowel/bladder incontinence. C-collar placed in triage. 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

## 2014-07-07 NOTE — H&P (Addendum)
Tara Schroeder is an 53 y.o. female with a cervical mass presents for surgical mngt.  Pt noted to have mass suspicious for fibroid at cervix at time of annual/pap.  Unable to remove in office b/c of pain.  Unable to assess endometrial cavity by [facility] because of mass blocking internal cervical os.  Pt denies pain and vb.     Past Medical History  Diagnosis Date  . Hyperlipidemia   . Migraines   . Obesity   . Fibroids   . Endometriosis     Past Surgical History  Procedure Laterality Date  . Cholecystectomy, laparoscopic  2010  . Uterine ablation    . Total knee arthroplasty      Right  . Appendectomy  2010  . Colonoscopy  2014, 2006  . Endometrial ablation  2008    Family History  Problem Relation Age of Onset  . Multiple myeloma Mother   . High blood pressure Father   . Heart disease Father   . Diabetes Father     Social History:  reports that she quit smoking about 2 years ago. She does not have any smokeless tobacco history on file. She reports that she drinks alcohol. She reports that she does not use illicit drugs.  Allergies:  Allergies  Allergen Reactions  . Bee Venom Swelling and Other (See Comments)    Possibly other reactions  . Sulfa Antibiotics Other (See Comments)    unknown  . Terramycin [Oxytetracycline] Other (See Comments)    unknown  . Tetracyclines & Related Other (See Comments)    unknown    No prescriptions prior to admission    ROS  Physical Exam  AF, VSS Gen - NAD CV - RRR Lungs - clear Abd - soft, NT/ND PV - cervical mass noted - suspicious for fibroid.  Uterus mobile nt.  No adnexal masses/pain  [place]:  No uterine or adnexal masses, no free fluid Pap:  negative  Assessment/Plan:  Cervical mass Hysteroscopy, D&C, resection of mass R/b/a discussed, questions answered, informed consent. Plan of care reviewed.  [name] 07/07/2014, 2:50 PM

## 2014-07-09 MED ORDER — DEXTROSE 5 % IV SOLN
2.0000 g | INTRAVENOUS | Status: AC
  Administered 2014-07-10: 2 g via INTRAVENOUS
  Filled 2014-07-09: qty 2

## 2014-07-10 ENCOUNTER — Encounter (HOSPITAL_COMMUNITY): Payer: Self-pay | Admitting: *Deleted

## 2014-07-10 ENCOUNTER — Encounter (HOSPITAL_COMMUNITY)
Admission: RE | Disposition: A | Discharge status: Home / Self Care | Payer: Self-pay | Source: Ambulatory Visit | Attending: Obstetrics and Gynecology

## 2014-07-10 ENCOUNTER — Ambulatory Visit (HOSPITAL_COMMUNITY): Payer: BC Managed Care – PPO | Admitting: Anesthesiology

## 2014-07-10 ENCOUNTER — Ambulatory Visit (HOSPITAL_COMMUNITY)
Admission: RE | Admit: 2014-07-10 | Discharge: 2014-07-10 | Disposition: A | Discharge status: Home / Self Care | Payer: BC Managed Care – PPO | Source: Ambulatory Visit | Attending: Obstetrics and Gynecology | Admitting: Obstetrics and Gynecology

## 2014-07-10 DIAGNOSIS — Z91030 Bee allergy status: Secondary | ICD-10-CM | POA: Insufficient documentation

## 2014-07-10 DIAGNOSIS — Z888 Allergy status to other drugs, medicaments and biological substances status: Secondary | ICD-10-CM | POA: Diagnosis not present

## 2014-07-10 DIAGNOSIS — G43909 Migraine, unspecified, not intractable, without status migrainosus: Secondary | ICD-10-CM | POA: Diagnosis not present

## 2014-07-10 DIAGNOSIS — Z833 Family history of diabetes mellitus: Secondary | ICD-10-CM | POA: Insufficient documentation

## 2014-07-10 DIAGNOSIS — Z880 Allergy status to penicillin: Secondary | ICD-10-CM | POA: Diagnosis not present

## 2014-07-10 DIAGNOSIS — E785 Hyperlipidemia, unspecified: Secondary | ICD-10-CM | POA: Diagnosis not present

## 2014-07-10 DIAGNOSIS — Z882 Allergy status to sulfonamides status: Secondary | ICD-10-CM | POA: Diagnosis not present

## 2014-07-10 DIAGNOSIS — Z87891 Personal history of nicotine dependence: Secondary | ICD-10-CM | POA: Insufficient documentation

## 2014-07-10 DIAGNOSIS — Z96651 Presence of right artificial knee joint: Secondary | ICD-10-CM | POA: Diagnosis not present

## 2014-07-10 DIAGNOSIS — N800 Endometriosis of uterus: Secondary | ICD-10-CM | POA: Diagnosis not present

## 2014-07-10 DIAGNOSIS — Z8249 Family history of ischemic heart disease and other diseases of the circulatory system: Secondary | ICD-10-CM | POA: Insufficient documentation

## 2014-07-10 DIAGNOSIS — Z96659 Presence of unspecified artificial knee joint: Secondary | ICD-10-CM | POA: Insufficient documentation

## 2014-07-10 DIAGNOSIS — Z6830 Body mass index (BMI) 30.0-30.9, adult: Secondary | ICD-10-CM | POA: Insufficient documentation

## 2014-07-10 DIAGNOSIS — D260 Other benign neoplasm of cervix uteri: Secondary | ICD-10-CM | POA: Diagnosis present

## 2014-07-10 DIAGNOSIS — E669 Obesity, unspecified: Secondary | ICD-10-CM | POA: Insufficient documentation

## 2014-07-10 HISTORY — PX: DILATATION & CURETTAGE/HYSTEROSCOPY WITH MYOSURE: SHX6511

## 2014-07-10 LAB — CBC
HEMATOCRIT: 40.7 % (ref 36.0–46.0)
Hemoglobin: 14 g/dL (ref 12.0–15.0)
MCH: 31.6 pg (ref 26.0–34.0)
MCHC: 34.4 g/dL (ref 30.0–36.0)
MCV: 91.9 fL (ref 78.0–100.0)
Platelets: 223 10*3/uL (ref 150–400)
RBC: 4.43 MIL/uL (ref 3.87–5.11)
RDW: 13.5 % (ref 11.5–15.5)
WBC: 7 10*3/uL (ref 4.0–10.5)

## 2014-07-10 SURGERY — DILATATION & CURETTAGE/HYSTEROSCOPY WITH MYOSURE
Anesthesia: General | Site: Uterus

## 2014-07-10 MED ORDER — MEPERIDINE HCL 25 MG/ML IJ SOLN: 6.2500 mg | INTRAMUSCULAR | Status: DC | PRN

## 2014-07-10 MED ORDER — ONDANSETRON HCL 4 MG/2ML IJ SOLN
INTRAMUSCULAR | Status: DC | PRN
  Administered 2014-07-10: 4 mg via INTRAVENOUS

## 2014-07-10 MED ORDER — MIDAZOLAM HCL 2 MG/2ML IJ SOLN
INTRAMUSCULAR | Status: AC
Start: 2014-07-10 — End: 2014-07-10
  Filled 2014-07-10: qty 2

## 2014-07-10 MED ORDER — LACTATED RINGERS IV SOLN
INTRAVENOUS | Status: DC
  Administered 2014-07-10 (×2): via INTRAVENOUS

## 2014-07-10 MED ORDER — SODIUM CHLORIDE 0.9 % IR SOLN
Status: DC | PRN
  Administered 2014-07-10: 3000 mL

## 2014-07-10 MED ORDER — PROPOFOL 10 MG/ML IV BOLUS
INTRAVENOUS | Status: DC | PRN
  Administered 2014-07-10: 150 mg via INTRAVENOUS

## 2014-07-10 MED ORDER — OXYCODONE-ACETAMINOPHEN 5-325 MG PO TABS: 1.0000 | ORAL_TABLET | ORAL | Status: DC | PRN

## 2014-07-10 MED ORDER — FENTANYL CITRATE 0.05 MG/ML IJ SOLN: 25.0000 ug | INTRAMUSCULAR | Status: DC | PRN

## 2014-07-10 MED ORDER — LIDOCAINE HCL (CARDIAC) 20 MG/ML IV SOLN
INTRAVENOUS | Status: AC
  Filled 2014-07-10: qty 5

## 2014-07-10 MED ORDER — ACETAMINOPHEN 325 MG PO TABS
ORAL_TABLET | ORAL | Status: AC
  Administered 2014-07-10: 650 mg via ORAL
  Filled 2014-07-10: qty 2

## 2014-07-10 MED ORDER — KETOROLAC TROMETHAMINE 30 MG/ML IJ SOLN: 15.0000 mg | Freq: Once | INTRAMUSCULAR | Status: DC | PRN

## 2014-07-10 MED ORDER — GLYCOPYRROLATE 0.2 MG/ML IJ SOLN
INTRAMUSCULAR | Status: AC
  Filled 2014-07-10: qty 1

## 2014-07-10 MED ORDER — PROPOFOL 10 MG/ML IV EMUL
INTRAVENOUS | Status: AC
  Filled 2014-07-10: qty 40

## 2014-07-10 MED ORDER — FENTANYL CITRATE 0.05 MG/ML IJ SOLN
INTRAMUSCULAR | Status: DC | PRN
  Administered 2014-07-10 (×3): 50 ug via INTRAVENOUS

## 2014-07-10 MED ORDER — KETOROLAC TROMETHAMINE 30 MG/ML IJ SOLN
INTRAMUSCULAR | Status: AC
  Filled 2014-07-10: qty 1

## 2014-07-10 MED ORDER — LIDOCAINE HCL 1 % IJ SOLN
INTRAMUSCULAR | Status: DC | PRN
  Administered 2014-07-10: 5 mL

## 2014-07-10 MED ORDER — MIDAZOLAM HCL 2 MG/2ML IJ SOLN
INTRAMUSCULAR | Status: DC | PRN
  Administered 2014-07-10: 2 mg via INTRAVENOUS

## 2014-07-10 MED ORDER — ONDANSETRON HCL 4 MG/2ML IJ SOLN
INTRAMUSCULAR | Status: AC
  Filled 2014-07-10: qty 2

## 2014-07-10 MED ORDER — LIDOCAINE HCL (CARDIAC) 20 MG/ML IV SOLN
INTRAVENOUS | Status: DC | PRN
  Administered 2014-07-10: 60 mg via INTRAVENOUS

## 2014-07-10 MED ORDER — LIDOCAINE HCL 1 % IJ SOLN
INTRAMUSCULAR | Status: AC
  Filled 2014-07-10: qty 20

## 2014-07-10 MED ORDER — SCOPOLAMINE 1 MG/3DAYS TD PT72
MEDICATED_PATCH | TRANSDERMAL | Status: AC
  Filled 2014-07-10: qty 1

## 2014-07-10 MED ORDER — DEXAMETHASONE SODIUM PHOSPHATE 4 MG/ML IJ SOLN
INTRAMUSCULAR | Status: AC
  Filled 2014-07-10: qty 1

## 2014-07-10 MED ORDER — ACETAMINOPHEN 325 MG PO TABS
650.0000 mg | ORAL_TABLET | Freq: Once | ORAL | Status: AC
  Administered 2014-07-10: 650 mg via ORAL

## 2014-07-10 MED ORDER — SCOPOLAMINE 1 MG/3DAYS TD PT72
1.0000 | MEDICATED_PATCH | Freq: Once | TRANSDERMAL | Status: DC
  Administered 2014-07-10: 1.5 mg via TRANSDERMAL

## 2014-07-10 MED ORDER — KETOROLAC TROMETHAMINE 30 MG/ML IJ SOLN
INTRAMUSCULAR | Status: DC | PRN
  Administered 2014-07-10: 30 mg via INTRAVENOUS

## 2014-07-10 MED ORDER — ONDANSETRON HCL 4 MG/2ML IJ SOLN: 4.0000 mg | Freq: Once | INTRAMUSCULAR | Status: DC | PRN

## 2014-07-10 MED ORDER — DEXAMETHASONE SODIUM PHOSPHATE 10 MG/ML IJ SOLN
INTRAMUSCULAR | Status: DC | PRN
  Administered 2014-07-10: 4 mg via INTRAVENOUS

## 2014-07-10 MED ORDER — GLYCOPYRROLATE 0.2 MG/ML IJ SOLN
INTRAMUSCULAR | Status: DC | PRN
  Administered 2014-07-10: 0.1 mg via INTRAVENOUS

## 2014-07-10 MED ORDER — FENTANYL CITRATE 0.05 MG/ML IJ SOLN
INTRAMUSCULAR | Status: AC
  Filled 2014-07-10: qty 5

## 2014-07-10 SURGICAL SUPPLY — 25 items
ABLATOR ENDOMETRIAL BIPOLAR (ABLATOR) IMPLANT
CANISTER SUCT 3000ML (MISCELLANEOUS) ×2 IMPLANT
CATH ROBINSON RED A/P 16FR (CATHETERS) ×2 IMPLANT
CATH THERMACHOICE III (CATHETERS) IMPLANT
CLOTH BEACON ORANGE TIMEOUT ST (SAFETY) ×2 IMPLANT
CONTAINER PREFILL 10% NBF 60ML (FORM) ×4 IMPLANT
DEVICE MYOSURE CLASSIC (MISCELLANEOUS) IMPLANT
DEVICE MYOSURE LITE (MISCELLANEOUS) IMPLANT
DILATOR CANAL MILEX (MISCELLANEOUS) ×1 IMPLANT
ELECT REM PT RETURN 9FT ADLT (ELECTROSURGICAL) ×2
ELECTRODE REM PT RTRN 9FT ADLT (ELECTROSURGICAL) ×1 IMPLANT
FILTER ARTHROSCOPY CONVERTOR (FILTER) ×2 IMPLANT
GLOVE BIO SURGEON STRL SZ 6.5 (GLOVE) ×2 IMPLANT
GLOVE BIOGEL PI IND STRL 7.0 (GLOVE) ×1 IMPLANT
GLOVE BIOGEL PI INDICATOR 7.0 (GLOVE) ×3
GLOVE SURG SS PI 7.0 STRL IVOR (GLOVE) ×1 IMPLANT
GOWN STRL REUS W/TWL LRG LVL3 (GOWN DISPOSABLE) ×4 IMPLANT
LOOP ANGLED CUTTING 22FR (CUTTING LOOP) ×1 IMPLANT
PACK VAGINAL MINOR WOMEN LF (CUSTOM PROCEDURE TRAY) ×2 IMPLANT
PAD OB MATERNITY 4.3X12.25 (PERSONAL CARE ITEMS) ×2 IMPLANT
SEAL ROD LENS SCOPE MYOSURE (ABLATOR) ×2 IMPLANT
TOWEL OR 17X24 6PK STRL BLUE (TOWEL DISPOSABLE) ×4 IMPLANT
TUBING AQUILEX INFLOW (TUBING) ×2 IMPLANT
TUBING AQUILEX OUTFLOW (TUBING) ×2 IMPLANT
WATER STERILE IRR 1000ML POUR (IV SOLUTION) ×2 IMPLANT

## 2014-07-10 NOTE — Op Note (Signed)
NAMESHARYON, PEITZ            ACCOUNT NO.:  [id number]  MEDICAL RECORD NO.:  [id number]  LOCATION:  [facility]                          FACILITY:  [facility]  PHYSICIAN:  Marylynn Pearson, MD    DATE OF BIRTH:  19-Apr-1961  DATE OF PROCEDURE: DATE OF DISCHARGE:                              OPERATIVE REPORT   PREOPERATIVE DIAGNOSIS:  Cervical mass.  POSTOPERATIVE DIAGNOSIS:  Cervical mass.  PROCEDURES: 1. Paracervical block. 2. Hysteroscopy. 3. Cervical dilation. 4. Endocervical curettage. 5. Removal of endocervical mass.  SURGEON:  Marylynn Pearson, M.D.  ANESTHESIA:  General.  COMPLICATIONS:  None.  CONDITION:  Stable to recovery room.  SPECIMEN:  Endocervical mass with endocervical curettings.  PROCEDURE IN DETAIL:  The patient was taken to the operating room where she was given general anesthesia.  She was placed in the dorsal lithotomy position using Allen stirrups, prepped and draped in sterile fashion.  An in-and-out catheter was used to drain her bladder.  Bivalve speculum was placed in the vagina, and 1 mL of 1% lidocaine was injected at the 12 o'clock position of the cervix.  Single-tooth tenaculum was attached to the cervix.  Large cervical mass was identified. Paracervical block was performed, and the cervical mass was grasped with ring forceps, twisted in a clockwise fashion and removed.  Cervical dilation was performed; however, I was unable to dilate the internal cervical os in order to allow the hysteroscope entry into the endometrial cavity.  The base of the mass was identified and the cervical canal, and it was excised using a curette.  All instruments were then removed from the cervix and vagina. The cervix was hemostatic.  Speculum was removed.  She was extubated and taken to the recovery room in stable condition.  Sponge, lap, needle, and instrument counts were correct x2.     Marylynn Pearson, MD     GA/MEDQ  D:  07/10/2014  T:  07/10/2014  Job:   [id number]

## 2014-07-10 NOTE — Anesthesia Preprocedure Evaluation (Signed)
Anesthesia Evaluation  Patient identified by MRN, date of birth, ID band Patient awake    Reviewed: Allergy & Precautions, H&P , NPO status , Patient's Chart, lab work & pertinent test results, reviewed documented beta blocker date and time   Airway Mallampati: I  TM Distance: >3 FB Neck ROM: full    Dental no notable dental hx. (+) Teeth Intact   Pulmonary former smoker,    Pulmonary exam normal       Cardiovascular negative cardio ROS      Neuro/Psych negative psych ROS   GI/Hepatic negative GI ROS, Neg liver ROS,   Endo/Other  negative endocrine ROS  Renal/GU negative Renal ROS     Musculoskeletal Neck issues. Needs to be positioned prior to going to sleep.   Abdominal Normal abdominal exam  (+)   Peds  Hematology negative hematology ROS (+)   Anesthesia Other Findings   Reproductive/Obstetrics negative OB ROS                             Anesthesia Physical Anesthesia Plan  ASA: II  Anesthesia Plan: General   Post-op Pain Management:    Induction: Intravenous  Airway Management Planned: LMA  Additional Equipment:   Intra-op Plan:   Post-operative Plan:   Informed Consent: I have reviewed the patients History and Physical, chart, labs and discussed the procedure including the risks, benefits and alternatives for the proposed anesthesia with the patient or authorized representative who has indicated his/her understanding and acceptance.     Plan Discussed with: CRNA and Surgeon  Anesthesia Plan Comments:         Anesthesia Quick Evaluation

## 2014-07-10 NOTE — Transfer of Care (Signed)
Immediate Anesthesia Transfer of Care Note  Patient: Tara Schroeder  Procedure(s) Performed: Procedure(s): DILATATION & CURETTAGE/HYSTEROSCOPY (N/A)  Patient Location: PACU  Anesthesia Type:General  Level of Consciousness: awake, alert , oriented and patient cooperative  Airway & Oxygen Therapy: Patient Spontanous Breathing and Patient connected to nasal cannula oxygen  Post-op Assessment: Report given to PACU RN and Post -op Vital signs reviewed and stable  Post vital signs: Reviewed and stable  Complications: No apparent anesthesia complications

## 2014-07-10 NOTE — Discharge Instructions (Signed)
DISCHARGE INSTRUCTIONS: HYSTEROSCOPY / ENDOMETRIAL ABLATION The following instructions have been prepared to help you care for yourself upon your return home.  No Ibuprofen containing products (ie Advil, Aleve, Motrin, etc.) until after 2:00 pm today.  Personal hygiene:  Use sanitary pads for vaginal drainage, not tampons.  Shower the day after your procedure.  NO tub baths, pools or Jacuzzis for 2-3 weeks.  Wipe front to back after using the bathroom.  Activity and limitations:  Do NOT drive or operate any equipment for 24 hours. The effects of anesthesia are still present and drowsiness may result.  Do NOT rest in bed all day.  Walking is encouraged.  Walk up and down stairs slowly.  You may resume your normal activity in one to two days or as indicated by your physician. Sexual activity: NO intercourse for at least 2 weeks after the procedure, or as indicated by your Doctor.  Diet: Eat a light meal as desired this evening. You may resume your usual diet tomorrow.  Return to Work: You may resume your work activities in one to two days or as indicated by Marine scientist.  What to expect after your surgery: Expect to have vaginal bleeding/discharge for 2-3 days and spotting for up to 10 days. It is not unusual to have soreness for up to 1-2 weeks. You may have a slight burning sensation when you urinate for the first day. Mild cramps may continue for a couple of days. You may have a regular period in 2-6 weeks.  Call your doctor for any of the following:  Excessive vaginal bleeding or clotting, saturating and changing one pad every hour.  Inability to urinate 6 hours after discharge from hospital.  Pain not relieved by pain medication.  Fever of 100.4 F or greater.  Unusual vaginal discharge or odor.  Return to office _________________Call for an appointment ___________________ Patients signature: ______________________ Nurses signature  ________________________  [facility] [phone]

## 2014-07-10 NOTE — Anesthesia Postprocedure Evaluation (Signed)
  Anesthesia Post-op Note  Anesthesia Post Note  Patient: Tara Schroeder  Procedure(s) Performed: Procedure(s) (LRB): DILATATION & CURETTAGE/HYSTEROSCOPY (N/A)  Anesthesia type: General  Patient location: PACU  Post pain: Pain level controlled  Post assessment: Post-op Vital signs reviewed  Last Vitals:  Filed Vitals:   07/10/14 0845  BP: 118/74  Pulse: 61  Temp:   Resp: 14    Post vital signs: Reviewed  Level of consciousness: sedated  Complications: No apparent anesthesia complications

## 2014-07-13 ENCOUNTER — Encounter (HOSPITAL_COMMUNITY): Payer: Self-pay | Admitting: Obstetrics and Gynecology

## 2016-06-01 DIAGNOSIS — H60333 Swimmer's ear, bilateral: Secondary | ICD-10-CM | POA: Diagnosis not present

## 2016-06-01 DIAGNOSIS — H6123 Impacted cerumen, bilateral: Secondary | ICD-10-CM | POA: Diagnosis not present

## 2016-07-06 DIAGNOSIS — Z01419 Encounter for gynecological examination (general) (routine) without abnormal findings: Secondary | ICD-10-CM | POA: Diagnosis not present

## 2016-07-06 DIAGNOSIS — Z6829 Body mass index (BMI) 29.0-29.9, adult: Secondary | ICD-10-CM | POA: Diagnosis not present

## 2016-07-06 DIAGNOSIS — Z1231 Encounter for screening mammogram for malignant neoplasm of breast: Secondary | ICD-10-CM | POA: Diagnosis not present

## 2016-08-23 DIAGNOSIS — H6061 Unspecified chronic otitis externa, right ear: Secondary | ICD-10-CM | POA: Diagnosis not present

## 2016-08-23 DIAGNOSIS — H6123 Impacted cerumen, bilateral: Secondary | ICD-10-CM | POA: Diagnosis not present

## 2017-01-16 DIAGNOSIS — F17200 Nicotine dependence, unspecified, uncomplicated: Secondary | ICD-10-CM | POA: Diagnosis not present

## 2017-06-19 ENCOUNTER — Encounter: Payer: Self-pay | Admitting: Family Medicine

## 2017-06-19 ENCOUNTER — Ambulatory Visit: Payer: Self-pay | Admitting: Family Medicine

## 2017-06-19 VITALS — BP 124/80 | HR 68 | Temp 98.1°F | Ht 66.5 in | Wt 192.2 lb

## 2017-06-19 DIAGNOSIS — M1990 Unspecified osteoarthritis, unspecified site: Secondary | ICD-10-CM | POA: Insufficient documentation

## 2017-06-19 DIAGNOSIS — M150 Primary generalized (osteo)arthritis: Secondary | ICD-10-CM

## 2017-06-19 DIAGNOSIS — Z87891 Personal history of nicotine dependence: Secondary | ICD-10-CM

## 2017-06-19 DIAGNOSIS — M159 Polyosteoarthritis, unspecified: Secondary | ICD-10-CM

## 2017-06-19 DIAGNOSIS — Z86010 Personal history of colonic polyps: Secondary | ICD-10-CM

## 2017-06-19 DIAGNOSIS — M8949 Other hypertrophic osteoarthropathy, multiple sites: Secondary | ICD-10-CM

## 2017-06-19 NOTE — Progress Notes (Signed)
   Subjective:    Patient ID: Tara Schroeder, female    DOB: 06/17/1961, 56 y.o.   MRN: [id number]  HPI 56 yr old female to establish with [place] after transferring from Dr. Jani Gravel. She feels well and has no complaints. She has arthritis pains, especially in the neck, and she takes glucosamine daily for this. She will take Aleve if the pain becomes more troublesome. She gets regular mammograms. She quit smoking some years ago and she asks about screening for lung cancer. She works as an Barista.    Review of Systems  Constitutional: Negative.   Respiratory: Negative.   Cardiovascular: Negative.   Gastrointestinal: Negative.   Genitourinary: Negative.   Musculoskeletal: Positive for arthralgias.  Neurological: Negative.        Objective:   Physical Exam  Constitutional: She is oriented to person, place, and time. She appears well-developed and well-nourished.  Neck: No thyromegaly present.  Cardiovascular: Normal rate, regular rhythm, normal heart sounds and intact distal pulses.  Pulmonary/Chest: Effort normal and breath sounds normal. No respiratory distress. She has no wheezes. She has no rales.  Lymphadenopathy:    She has no cervical adenopathy.  Neurological: She is alert and oriented to person, place, and time.          Assessment & Plan:  Intro visit for this patient. Her arthritis seems to be stable. She will return soon for a well exam and fasting labs. We agreed to give her a TDaP at that time, also we will screen for hepatitis C and get a CXR.  Alysia Penna, MD

## 2017-07-17 ENCOUNTER — Ambulatory Visit (INDEPENDENT_AMBULATORY_CARE_PROVIDER_SITE_OTHER): Payer: BLUE CROSS/BLUE SHIELD | Admitting: Family Medicine

## 2017-07-17 ENCOUNTER — Encounter: Payer: Self-pay | Admitting: Family Medicine

## 2017-07-17 VITALS — BP 116/60 | HR 65 | Temp 97.8°F | Ht 66.75 in | Wt 192.6 lb

## 2017-07-17 DIAGNOSIS — Z0000 Encounter for general adult medical examination without abnormal findings: Secondary | ICD-10-CM

## 2017-07-17 DIAGNOSIS — Z23 Encounter for immunization: Secondary | ICD-10-CM

## 2017-07-17 LAB — POC URINALSYSI DIPSTICK (AUTOMATED)
BILIRUBIN UA: NEGATIVE
Blood, UA: NEGATIVE
GLUCOSE UA: NEGATIVE
KETONES UA: NEGATIVE
LEUKOCYTES UA: NEGATIVE
NITRITE UA: NEGATIVE
Protein, UA: NEGATIVE
Spec Grav, UA: 1.015 (ref 1.010–1.025)
Urobilinogen, UA: 0.2 E.U./dL
pH, UA: 6.5 (ref 5.0–8.0)

## 2017-07-17 LAB — CBC WITH DIFFERENTIAL/PLATELET
BASOS ABS: 0 10*3/uL (ref 0.0–0.1)
Basophils Relative: 0.6 % (ref 0.0–3.0)
EOS PCT: 3.2 % (ref 0.0–5.0)
Eosinophils Absolute: 0.2 10*3/uL (ref 0.0–0.7)
HEMATOCRIT: 42.7 % (ref 36.0–46.0)
Hemoglobin: 14.5 g/dL (ref 12.0–15.0)
LYMPHS PCT: 30.4 % (ref 12.0–46.0)
Lymphs Abs: 1.6 10*3/uL (ref 0.7–4.0)
MCHC: 34 g/dL (ref 30.0–36.0)
MCV: 93.6 fl (ref 78.0–100.0)
Monocytes Absolute: 0.3 10*3/uL (ref 0.1–1.0)
Monocytes Relative: 5.2 % (ref 3.0–12.0)
NEUTROS ABS: 3.2 10*3/uL (ref 1.4–7.7)
NEUTROS PCT: 60.6 % (ref 43.0–77.0)
PLATELETS: 202 10*3/uL (ref 150.0–400.0)
RBC: 4.57 Mil/uL (ref 3.87–5.11)
RDW: 13.9 % (ref 11.5–15.5)
WBC: 5.3 10*3/uL (ref 4.0–10.5)

## 2017-07-17 LAB — HEPATIC FUNCTION PANEL
ALK PHOS: 77 U/L (ref 39–117)
ALT: 22 U/L (ref 0–35)
AST: 19 U/L (ref 0–37)
Albumin: 4.5 g/dL (ref 3.5–5.2)
BILIRUBIN DIRECT: 0.1 mg/dL (ref 0.0–0.3)
Total Bilirubin: 0.7 mg/dL (ref 0.2–1.2)
Total Protein: 7 g/dL (ref 6.0–8.3)

## 2017-07-17 LAB — LIPID PANEL
CHOL/HDL RATIO: 3
Cholesterol: 175 mg/dL (ref 0–200)
HDL: 55.7 mg/dL (ref >39.00)
LDL Cholesterol: 89 mg/dL (ref 0–99)
NonHDL: 119.08
Triglycerides: 148 mg/dL (ref 0.0–149.0)
VLDL: 29.6 mg/dL (ref 0.0–40.0)

## 2017-07-17 LAB — BASIC METABOLIC PANEL
BUN: 15 mg/dL (ref 6–23)
CHLORIDE: 104 meq/L (ref 96–112)
CO2: 29 meq/L (ref 19–32)
Calcium: 9.3 mg/dL (ref 8.4–10.5)
Creatinine, Ser: 0.62 mg/dL (ref 0.40–1.20)
GFR: 105.56 mL/min (ref >60.00)
Glucose, Bld: 81 mg/dL (ref 70–99)
Potassium: 4.1 mEq/L (ref 3.5–5.1)
Sodium: 141 mEq/L (ref 135–145)

## 2017-07-17 LAB — TSH: TSH: 5.48 u[IU]/mL — ABNORMAL HIGH (ref 0.35–4.50)

## 2017-07-17 NOTE — Progress Notes (Signed)
   Subjective:    Patient ID: Tara Schroeder, female    DOB: September 17, 1960, 56 y.o.   MRN: [id number]  HPI Here for a well exam. She feels fine. She walks her dogs every day for exercise.    Review of Systems  Constitutional: Negative.   HENT: Negative.   Eyes: Negative.   Respiratory: Negative.   Cardiovascular: Negative.   Gastrointestinal: Negative.   Genitourinary: Negative for decreased urine volume, difficulty urinating, dyspareunia, dysuria, enuresis, flank pain, frequency, hematuria, pelvic pain and urgency.  Musculoskeletal: Negative.   Skin: Negative.   Neurological: Negative.   Psychiatric/Behavioral: Negative.        Objective:   Physical Exam  Constitutional: She is oriented to person, place, and time. She appears well-developed and well-nourished. No distress.  HENT:  Head: Normocephalic and atraumatic.  Right Ear: External ear normal.  Left Ear: External ear normal.  Nose: Nose normal.  Mouth/Throat: Oropharynx is clear and moist. No oropharyngeal exudate.  Eyes: Conjunctivae and EOM are normal. Pupils are equal, round, and reactive to light. No scleral icterus.  Neck: Normal range of motion. Neck supple. No JVD present. No thyromegaly present.  Cardiovascular: Normal rate, regular rhythm, normal heart sounds and intact distal pulses. Exam reveals no gallop and no friction rub.  No murmur heard. Pulmonary/Chest: Effort normal and breath sounds normal. No respiratory distress. She has no wheezes. She has no rales. She exhibits no tenderness.  Abdominal: Soft. Bowel sounds are normal. She exhibits no distension and no mass. There is no tenderness. There is no rebound and no guarding.  Musculoskeletal: Normal range of motion. She exhibits no edema or tenderness.  Lymphadenopathy:    She has no cervical adenopathy.  Neurological: She is alert and oriented to person, place, and time. She has normal reflexes. No cranial nerve deficit. She exhibits normal muscle tone.  Coordination normal.  Skin: Skin is warm and dry. No rash noted. No erythema.  Psychiatric: She has a normal mood and affect. Her behavior is normal. Judgment and thought content normal.          Assessment & Plan:  Well exam. We discussed diet and exercise. Get fasting labs.  Alysia Penna, MD

## 2017-07-18 LAB — HEPATITIS C ANTIBODY
HEP C AB: NONREACTIVE
SIGNAL TO CUT-OFF: 0.02 (ref <1.00)

## 2017-07-23 MED ORDER — LEVOTHYROXINE SODIUM 50 MCG PO TABS: 50.0000 ug | ORAL_TABLET | Freq: Every day | ORAL | 3 refills | Status: DC

## 2017-07-23 NOTE — Addendum Note (Signed)
Addended by: Myriam Forehand on: 07/23/2017 12:34 PM   Modules accepted: Orders

## 2017-08-14 DIAGNOSIS — Z1231 Encounter for screening mammogram for malignant neoplasm of breast: Secondary | ICD-10-CM | POA: Diagnosis not present

## 2017-08-14 DIAGNOSIS — Z01419 Encounter for gynecological examination (general) (routine) without abnormal findings: Secondary | ICD-10-CM | POA: Diagnosis not present

## 2017-08-14 DIAGNOSIS — Z6831 Body mass index (BMI) 31.0-31.9, adult: Secondary | ICD-10-CM | POA: Diagnosis not present

## 2017-09-11 ENCOUNTER — Encounter: Payer: Self-pay | Admitting: Family Medicine

## 2017-09-11 ENCOUNTER — Ambulatory Visit: Payer: BLUE CROSS/BLUE SHIELD | Admitting: Family Medicine

## 2017-09-11 VITALS — BP 130/70 | HR 71 | Temp 97.9°F | Wt 197.4 lb

## 2017-09-11 DIAGNOSIS — B372 Candidiasis of skin and nail: Secondary | ICD-10-CM | POA: Diagnosis not present

## 2017-09-11 DIAGNOSIS — F17210 Nicotine dependence, cigarettes, uncomplicated: Secondary | ICD-10-CM | POA: Diagnosis not present

## 2017-09-11 DIAGNOSIS — E039 Hypothyroidism, unspecified: Secondary | ICD-10-CM | POA: Insufficient documentation

## 2017-09-11 MED ORDER — KETOCONAZOLE 2 % EX CREA: 1.0000 "application " | TOPICAL_CREAM | Freq: Two times a day (BID) | CUTANEOUS | 2 refills | Status: DC

## 2017-09-11 MED ORDER — VARENICLINE TARTRATE 1 MG PO TABS
1.0000 mg | ORAL_TABLET | Freq: Two times a day (BID) | ORAL | 1 refills | Status: DC
Start: 2017-09-11 — End: 2018-10-30

## 2017-09-11 MED ORDER — VARENICLINE TARTRATE 0.5 MG X 11 & 1 MG X 42 PO MISC: ORAL | 0 refills | Status: DC

## 2017-09-11 NOTE — Progress Notes (Signed)
   Subjective:    Patient ID: Landry Corporal, female    DOB: [birth year], 57 y.o.   MRN: [id number]  HPI Here for several issues. First she has been smoking again and asks for Chantix to help her quit again. Second she has been taking Synthroid and we will check her levels again in a bout 6 weeks. Last she has had irritation and a foul odor in the belly button for the past month.    Review of Systems  Constitutional: Negative.   Respiratory: Negative.   Cardiovascular: Negative.   Endocrine: Negative.        Objective:   Physical Exam  Constitutional: She appears well-developed and well-nourished.  Neck: No thyromegaly present.  Cardiovascular: Normal rate, regular rhythm, normal heart sounds and intact distal pulses.  Pulmonary/Chest: Effort normal and breath sounds normal. No respiratory distress. She has no wheezes. She has no rales.  Lymphadenopathy:    She has no cervical adenopathy.  Skin:  The umbilicus has some erythema with no DC seen           Assessment & Plan:  She has a yeast infection in the umbilicus and we will treat this with Ketoconazole cream. We ordered Chantix to help her quit smoking. We will check a thyroid panel next month.  Alysia Penna, MD

## 2017-10-05 ENCOUNTER — Encounter: Payer: Self-pay | Admitting: Family Medicine

## 2017-10-05 ENCOUNTER — Ambulatory Visit: Payer: BLUE CROSS/BLUE SHIELD | Admitting: Family Medicine

## 2017-10-05 VITALS — BP 116/80 | HR 68 | Temp 98.1°F | Wt 195.6 lb

## 2017-10-05 DIAGNOSIS — B379 Candidiasis, unspecified: Secondary | ICD-10-CM | POA: Diagnosis not present

## 2017-10-05 MED ORDER — FLUCONAZOLE 100 MG PO TABS
100.0000 mg | ORAL_TABLET | Freq: Two times a day (BID) | ORAL | 0 refills | Status: DC
Start: 2017-10-05 — End: 2018-08-20

## 2017-10-05 NOTE — Progress Notes (Signed)
   Subjective:    Patient ID: Tara Schroeder, female    DOB: January 28, 1961, 57 y.o.   MRN: [id number]  HPI Here for continued trouble with her navel. We saw her a few weeks ago for a yeast infection causing itching and DC and a foul odor. She has been using Ketoconazole cream bid and the DC and itchig have stopped. She still notices an odor however.    Review of Systems  Constitutional: Negative.   Respiratory: Negative.   Cardiovascular: Negative.        Objective:   Physical Exam  Constitutional: She appears well-developed and well-nourished.  Cardiovascular: Normal rate, regular rhythm, normal heart sounds and intact distal pulses.  Pulmonary/Chest: Effort normal and breath sounds normal. No respiratory distress. She has no wheezes. She has no rales.  Skin:  Her naval appears normal. The erythema has resolved           Assessment & Plan:  Partially treated Candidiasis. Add oral Fluconazole for 2 weeks.  Alysia Penna, MD

## 2017-10-19 ENCOUNTER — Telehealth: Payer: Self-pay | Admitting: *Deleted

## 2017-10-19 DIAGNOSIS — B379 Candidiasis, unspecified: Secondary | ICD-10-CM

## 2017-10-19 NOTE — Telephone Encounter (Signed)
Copied from [place]. 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 Per Glo Herring, she was advised to call for this if the last time the medicine did not work. Call back is [phone]

## 2017-10-19 NOTE — Telephone Encounter (Signed)
Sent to PCP to place referral  

## 2017-10-22 NOTE — Telephone Encounter (Signed)
Called and spoke with pt. Pt advised and voiced understanding.  

## 2017-10-22 NOTE — Telephone Encounter (Signed)
The referral was done  

## 2017-10-25 ENCOUNTER — Other Ambulatory Visit (INDEPENDENT_AMBULATORY_CARE_PROVIDER_SITE_OTHER): Payer: BLUE CROSS/BLUE SHIELD

## 2017-10-25 DIAGNOSIS — E039 Hypothyroidism, unspecified: Secondary | ICD-10-CM

## 2017-10-25 LAB — TSH: TSH: 4.93 u[IU]/mL — AB (ref 0.35–4.50)

## 2017-10-25 LAB — T3, FREE: T3, Free: 3.2 pg/mL (ref 2.3–4.2)

## 2017-10-25 LAB — T4, FREE: Free T4: 0.64 ng/dL (ref 0.60–1.60)

## 2017-10-30 ENCOUNTER — Telehealth: Payer: Self-pay | Admitting: Family Medicine

## 2017-10-30 MED ORDER — LEVOTHYROXINE SODIUM 100 MCG PO TABS: 100.0000 ug | ORAL_TABLET | Freq: Every day | ORAL | 3 refills | Status: DC

## 2017-10-30 NOTE — Telephone Encounter (Signed)
Prescription has been sent to pt's requested pharmacy.

## 2017-10-30 NOTE — Telephone Encounter (Signed)
Pt given results per Dr Alysia Penna, "Her Synthroid dose is still a bit low. Increase to 100 mcg daily. Call in #90 with 3 rf and recheck TSH in 90 days"; pt verbalizes understanding and also schedules lab appointment for 02/07/18 at 1100; please send new prescription to [facility]; she also says that she will take 2- 50 mcg pills until new script is ready; will route to office for notification of this encounter; unable to chart in result note because encounter not created

## 2017-10-30 NOTE — Addendum Note (Signed)
Addended by: Myriam Forehand on: 10/30/2017 01:27 PM   Modules accepted: Orders

## 2017-11-08 DIAGNOSIS — L308 Other specified dermatitis: Secondary | ICD-10-CM | POA: Diagnosis not present

## 2017-11-08 DIAGNOSIS — L309 Dermatitis, unspecified: Secondary | ICD-10-CM | POA: Diagnosis not present

## 2017-12-05 DIAGNOSIS — B9689 Other specified bacterial agents as the cause of diseases classified elsewhere: Secondary | ICD-10-CM | POA: Diagnosis not present

## 2017-12-05 DIAGNOSIS — L089 Local infection of the skin and subcutaneous tissue, unspecified: Secondary | ICD-10-CM | POA: Diagnosis not present

## 2017-12-05 DIAGNOSIS — D1801 Hemangioma of skin and subcutaneous tissue: Secondary | ICD-10-CM | POA: Diagnosis not present

## 2017-12-05 DIAGNOSIS — D229 Melanocytic nevi, unspecified: Secondary | ICD-10-CM | POA: Diagnosis not present

## 2018-01-11 ENCOUNTER — Ambulatory Visit: Payer: Self-pay | Admitting: Family Medicine

## 2018-01-11 ENCOUNTER — Encounter: Payer: Self-pay | Admitting: Family Medicine

## 2018-01-11 ENCOUNTER — Ambulatory Visit: Payer: BLUE CROSS/BLUE SHIELD | Admitting: Family Medicine

## 2018-01-11 VITALS — BP 102/62 | HR 65 | Temp 98.2°F | Wt 193.0 lb

## 2018-01-11 DIAGNOSIS — H1131 Conjunctival hemorrhage, right eye: Secondary | ICD-10-CM

## 2018-01-11 NOTE — Progress Notes (Signed)
   Subjective:    Patient ID: Tara Schroeder, female    DOB: Jul 30, 1961, 57 y.o.   MRN: [id number]  HPI Here for several days of redness and mild pain in the right eye. No sinus symptoms or fever. No recent trauma. Her vision is normal.    Review of Systems  Constitutional: Negative.   HENT: Negative for congestion, ear pain, facial swelling, postnasal drip, sinus pressure, sinus pain and sore throat.   Eyes: Positive for pain and redness. Negative for photophobia, discharge, itching and visual disturbance.  Respiratory: Negative.   Hematological: Does not bruise/bleed easily.       Objective:   Physical Exam  Constitutional: She is oriented to person, place, and time. She appears well-developed and well-nourished. She does not appear ill. No distress.  HENT:  Right Ear: External ear normal.  Left Ear: External ear normal.  Nose: Nose normal.  Mouth/Throat: Oropharynx is clear and moist.  Eyes: Pupils are equal, round, and reactive to light. EOM are normal. Right eye exhibits no discharge. Left eye exhibits no discharge.  The right lateral conjunctiva has a hemorrhage. The rest of the eye is clear. The left eye is clear.   Neck: Neck supple. No thyromegaly present.  Pulmonary/Chest: Effort normal and breath sounds normal.  Lymphadenopathy:    She has no cervical adenopathy.  Neurological: She is alert and oriented to person, place, and time. No cranial nerve deficit.          Assessment & Plan:  Conjunctival hemorrhage. I explained this is benign and will clear over the next week or two. Recheck prn.  Alysia Penna, MD

## 2018-02-07 ENCOUNTER — Other Ambulatory Visit (INDEPENDENT_AMBULATORY_CARE_PROVIDER_SITE_OTHER): Payer: BLUE CROSS/BLUE SHIELD

## 2018-02-07 DIAGNOSIS — E039 Hypothyroidism, unspecified: Secondary | ICD-10-CM | POA: Diagnosis not present

## 2018-02-07 LAB — TSH: TSH: 2.94 u[IU]/mL (ref 0.35–4.50)

## 2018-05-15 ENCOUNTER — Other Ambulatory Visit: Payer: Self-pay | Admitting: *Deleted

## 2018-05-15 DIAGNOSIS — L089 Local infection of the skin and subcutaneous tissue, unspecified: Secondary | ICD-10-CM | POA: Diagnosis not present

## 2018-05-15 MED ORDER — LEVOTHYROXINE SODIUM 100 MCG PO TABS: 100.0000 ug | ORAL_TABLET | Freq: Every day | ORAL | 3 refills | Status: DC

## 2018-05-21 ENCOUNTER — Other Ambulatory Visit: Payer: Self-pay | Admitting: Family Medicine

## 2018-05-21 MED ORDER — VARENICLINE TARTRATE 0.5 MG X 11 & 1 MG X 42 PO MISC: ORAL | 0 refills | Status: DC

## 2018-05-21 NOTE — Telephone Encounter (Signed)
Copied from [place] [id number]. Topic: Quick Communication - Rx Refill/Question >> May 21, 2018  8:52 AM Sheppard Coil, [employer] L wrote: Medication: varenicline (CHANTIX STARTING MONTH PAK) 0.5 MG X 11 & 1 MG X 42 tablet  Has the patient contacted their pharmacy? No.  Pt states she quit last year and has fallen off the wagon and would like to start the Chantix back. (Agent: If no, request that the patient contact the pharmacy for the refill.) (Agent: If yes, when and what did the pharmacy advise?)  Preferred Pharmacy (with phone number or street name): [id number] [place], [place] [phone] (Phone) [phone] (Fax)  Agent: Please be advised that RX refills may take up to 3 business days. We ask that you follow-up with your pharmacy.

## 2018-05-21 NOTE — Telephone Encounter (Signed)
Requested medication (s) are due for refill today:   Yes  Requested medication (s) are on the active medication list:   Yes  Future visit scheduled:   No.   She quit smoking last year but "has fallen off the wagon" and would like to start the Chantix back.   I did not know if Dr. Sarajane Jews would want to approve this or not even though the protocol is that it is ok to refill.   Last ordered: 09/11/17  #56  1 refill      Requested Prescriptions  Pending Prescriptions Disp Refills   varenicline (CHANTIX STARTING MONTH PAK) 0.5 MG X 11 & 1 MG X 42 tablet 53 tablet 0    Sig: Take one 0.5 mg tablet by mouth once daily for 3 days, then increase to one 0.5 mg tablet twice daily for 4 days, then increase to one 1 mg tablet twice daily.     Psychiatry:  Drug Dependence Therapy Passed - 05/21/2018  8:58 AM      Passed - Valid encounter within last 12 months    Recent Outpatient Visits          4 months ago Conjunctival hemorrhage of right eye   [facility] [place], MD   7 months ago Candidiasis   Therapist, music at [place], MD   8 months ago Candidiasis of skin   [facility] [place], MD   10 months ago Preventative health care   [facility] [place], MD   11 months ago Primary osteoarthritis involving multiple joints   Therapist, music at [facility], Ishmael Holter, MD

## 2018-05-23 ENCOUNTER — Other Ambulatory Visit: Payer: Self-pay

## 2018-05-23 MED ORDER — VARENICLINE TARTRATE 0.5 MG X 11 & 1 MG X 42 PO MISC: ORAL | 0 refills | Status: DC

## 2018-05-23 NOTE — Telephone Encounter (Signed)
Patient called and stated that pharmacy did not have Rx. Rx has been refaxed

## 2018-06-21 DIAGNOSIS — L089 Local infection of the skin and subcutaneous tissue, unspecified: Secondary | ICD-10-CM | POA: Diagnosis not present

## 2018-06-30 HISTORY — PX: COLONOSCOPY: SHX174

## 2018-08-05 DIAGNOSIS — Z1211 Encounter for screening for malignant neoplasm of colon: Secondary | ICD-10-CM | POA: Diagnosis not present

## 2018-08-07 DIAGNOSIS — L089 Local infection of the skin and subcutaneous tissue, unspecified: Secondary | ICD-10-CM | POA: Diagnosis not present

## 2018-08-08 DIAGNOSIS — L089 Local infection of the skin and subcutaneous tissue, unspecified: Secondary | ICD-10-CM | POA: Diagnosis not present

## 2018-08-13 ENCOUNTER — Encounter: Payer: Self-pay | Admitting: Family Medicine

## 2018-08-13 DIAGNOSIS — L02216 Cutaneous abscess of umbilicus: Secondary | ICD-10-CM

## 2018-08-13 NOTE — Telephone Encounter (Signed)
Dr. Fry please advise. Thanks  

## 2018-08-14 NOTE — Telephone Encounter (Signed)
Dr. Sarajane Jews please advise on the provider that she has appt with>  Thanks

## 2018-08-14 NOTE — Telephone Encounter (Signed)
I agree that belly button surgery would fix this problem permanently. I suggest a Pension scheme manager to do it. Is there someone she would like to see?

## 2018-08-16 NOTE — Telephone Encounter (Signed)
I think she is excellent. I did the referral

## 2018-08-19 DIAGNOSIS — Z13820 Encounter for screening for osteoporosis: Secondary | ICD-10-CM | POA: Diagnosis not present

## 2018-08-19 DIAGNOSIS — Z1231 Encounter for screening mammogram for malignant neoplasm of breast: Secondary | ICD-10-CM | POA: Diagnosis not present

## 2018-08-19 DIAGNOSIS — Z6830 Body mass index (BMI) 30.0-30.9, adult: Secondary | ICD-10-CM | POA: Diagnosis not present

## 2018-08-19 DIAGNOSIS — Z01419 Encounter for gynecological examination (general) (routine) without abnormal findings: Secondary | ICD-10-CM | POA: Diagnosis not present

## 2018-08-20 ENCOUNTER — Ambulatory Visit (INDEPENDENT_AMBULATORY_CARE_PROVIDER_SITE_OTHER): Payer: Self-pay | Admitting: Plastic Surgery

## 2018-08-20 ENCOUNTER — Encounter: Payer: Self-pay | Admitting: Plastic Surgery

## 2018-08-20 VITALS — BP 113/75 | HR 60 | Temp 97.6°F | Ht 67.0 in | Wt 190.0 lb

## 2018-08-20 DIAGNOSIS — T8189XA Other complications of procedures, not elsewhere classified, initial encounter: Secondary | ICD-10-CM

## 2018-08-20 DIAGNOSIS — S31105A Unspecified open wound of abdominal wall, periumbilic region without penetration into peritoneal cavity, initial encounter: Secondary | ICD-10-CM

## 2018-08-20 DIAGNOSIS — Z189 Retained foreign body fragments, unspecified material: Secondary | ICD-10-CM | POA: Insufficient documentation

## 2018-08-20 NOTE — Progress Notes (Signed)
In addition to the umbilical exploration the patient has requested an abdominoplasty.  This is a reasonable request based on her physical exam.  She is 5 feet 7 inches tall and weighs 190 pounds.  She has reasonable expectations for a reduction in her abdominal fat and fullness without trying to achieve an hourglass shape.  She is not interested in abdominal wall plication.  She is interested in some liposuction that would help with the fat and fullness.  I do not feel her rectus diastases or hernia.

## 2018-08-20 NOTE — Progress Notes (Signed)
Patient ID: Tara Schroeder, female    DOB: January 30, 1961, 58 y.o.   MRN: [id number]   Chief Complaint  Patient presents with  . Advice Only    for belly button reconstruction and tummy fold    The patient is a 58 year old white female here for evaluation of her abdominal area.  The patient states that she had abdominal surgery 10 years ago.  She had a laparoscopic partial open procedure for an appendectomy and cholecystectomy.  At the time she was diagnosed with endometriosis as well over the past year she has noted a slight bit of drainage coming from the umbilical area with a very strong malodor.  She was seen by her family physician regarding this condition.  Nothing seems to make it better and she is very concerned about it getting worse.  Her scar is well-healed on the most superficial aspect.  The inner aspect is very tender.  I do not feel a hernia or rectus diastases.  There is certainly concern for a retained stitch that may be abscessing at this time.   Review of Systems  Constitutional: Negative for activity change, appetite change and chills.  HENT: Negative.   Eyes: Negative.   Cardiovascular: Negative.   Gastrointestinal: Positive for abdominal pain. Negative for abdominal distention and diarrhea.  Endocrine: Negative.   Genitourinary: Negative.   Musculoskeletal: Negative for back pain.  Skin: Positive for wound.  Hematological: Negative.   Psychiatric/Behavioral: Negative.     Past Medical History:  Diagnosis Date  . Arthritis   . Endometriosis    sees Dr. Kathryne Eriksson   . Fibroids   . Hyperlipidemia   . Migraines   . Obesity     Past Surgical History:  Procedure Laterality Date  . APPENDECTOMY  2010  . CHOLECYSTECTOMY  2010  . CHOLECYSTECTOMY, LAPAROSCOPIC  2010  . COLONOSCOPY  2014   per Dr. Carol Ada, adenomatous polyps, repeat in 5 yrs   . DILATATION & CURETTAGE/HYSTEROSCOPY WITH MYOSURE N/A 07/10/2014   Procedure: DILATATION &  CURETTAGE/HYSTEROSCOPY;  Surgeon: Marylynn Pearson, MD;  Location: [facility];  Service: Gynecology;  Laterality: N/A;  . ENDOMETRIAL ABLATION  2008  . TOTAL KNEE ARTHROPLASTY     Right  . Uterine ablation        Current Outpatient Medications:  .  Calcium Carbonate-Vit D-Min (CALCIUM 1200 PO), Take by mouth., Disp: , Rfl:  .  gentamicin ointment (GARAMYCIN) 0.1 %, 1 (ONE) APPLICATION APPLY TO ABDOMEN TWICE DAILY, Disp: , Rfl: 1 .  levothyroxine (SYNTHROID, LEVOTHROID) 100 MCG tablet, Take 1 tablet (100 mcg total) by mouth daily., Disp: 90 tablet, Rfl: 3 .  polyethylene glycol (MIRALAX / GLYCOLAX) packet, Take 17 g by mouth 3 (three) times a week., Disp: , Rfl:  .  sertraline (ZOLOFT) 25 MG tablet, Take 25 mg daily by mouth., Disp: , Rfl:  .  Multiple Vitamin (MULTIVITAMIN) capsule, Take 1 capsule daily by mouth., Disp: , Rfl:  .  varenicline (CHANTIX CONTINUING MONTH PAK) 1 MG tablet, Take 1 tablet (1 mg total) by mouth 2 (two) times daily., Disp: 56 tablet, Rfl: 1 .  varenicline (CHANTIX STARTING MONTH PAK) 0.5 MG X 11 & 1 MG X 42 tablet, Take one 0.5 mg tablet by mouth once daily for 3 days, then increase to one 0.5 mg tablet twice daily for 4 days, then increase to one 1 mg tablet twice daily. (Patient not taking: Reported on 08/20/2018), Disp: 53 tablet, Rfl: 0  Objective:   Vitals:   08/20/18 1026  BP: 113/75  Pulse: 60  Temp: 97.6 F (36.4 C)  SpO2: 99%    Physical Exam Vitals signs and nursing note reviewed.  Constitutional:      Appearance: Normal appearance.  HENT:     Head: Normocephalic and atraumatic.     Nose: Nose normal.     Mouth/Throat:     Mouth: Mucous membranes are moist.  Neck:     Musculoskeletal: Normal range of motion.  Cardiovascular:     Rate and Rhythm: Normal rate.  Pulmonary:     Effort: Pulmonary effort is normal.  Abdominal:     General: Abdomen is flat. There is no distension.     Palpations: There is no mass.     Tenderness: There is  abdominal tenderness. There is no rebound.     Hernia: No hernia is present.  Skin:    General: Skin is warm.     Coloration: Skin is not jaundiced.     Findings: No bruising.  Neurological:     General: No focal deficit present.     Assessment & Plan:  Open wound of umbilical region, initial encounter  Retained suture, initial encounter  Recommend exploration of umbilical wound with excision of retained suture.  [place], DO

## 2018-08-22 DIAGNOSIS — D125 Benign neoplasm of sigmoid colon: Secondary | ICD-10-CM | POA: Diagnosis not present

## 2018-08-22 DIAGNOSIS — Z1211 Encounter for screening for malignant neoplasm of colon: Secondary | ICD-10-CM | POA: Diagnosis not present

## 2018-08-22 DIAGNOSIS — K635 Polyp of colon: Secondary | ICD-10-CM | POA: Diagnosis not present

## 2018-09-09 NOTE — Progress Notes (Signed)
[place],  This patient can be scheduled for surgery. The second half of the surgery is cosmetic, so I wasn't sure if she had confirmed with you whether she wanted to proceed with abdominoplasty or not?

## 2018-09-10 DIAGNOSIS — M6281 Muscle weakness (generalized): Secondary | ICD-10-CM | POA: Diagnosis not present

## 2018-09-10 DIAGNOSIS — R3915 Urgency of urination: Secondary | ICD-10-CM | POA: Diagnosis not present

## 2018-09-10 DIAGNOSIS — N393 Stress incontinence (female) (male): Secondary | ICD-10-CM | POA: Diagnosis not present

## 2018-09-17 ENCOUNTER — Encounter: Payer: Self-pay | Admitting: Family Medicine

## 2018-09-17 DIAGNOSIS — R3915 Urgency of urination: Secondary | ICD-10-CM | POA: Diagnosis not present

## 2018-09-17 DIAGNOSIS — N3946 Mixed incontinence: Secondary | ICD-10-CM | POA: Diagnosis not present

## 2018-09-17 DIAGNOSIS — M6281 Muscle weakness (generalized): Secondary | ICD-10-CM | POA: Diagnosis not present

## 2018-09-17 MED ORDER — VARENICLINE TARTRATE 0.5 MG X 11 & 1 MG X 42 PO MISC: ORAL | 0 refills | Status: DC

## 2018-09-17 NOTE — Telephone Encounter (Signed)
Dr. Fry please advise on refill. thanks 

## 2018-09-17 NOTE — Telephone Encounter (Signed)
Call in a Chantix starter pack

## 2018-09-18 ENCOUNTER — Telehealth: Payer: Self-pay | Admitting: Family Medicine

## 2018-09-20 MED ORDER — VARENICLINE TARTRATE 0.5 MG X 11 & 1 MG X 42 PO MISC: ORAL | 0 refills | Status: DC

## 2018-09-20 MED ORDER — SERTRALINE HCL 50 MG PO TABS: 50.0000 mg | ORAL_TABLET | Freq: Every day | ORAL | 3 refills | Status: DC

## 2018-09-20 NOTE — Telephone Encounter (Signed)
Duplicate message. 

## 2018-09-20 NOTE — Telephone Encounter (Signed)
Please call in Chantix starter pack and Sertraline 50 mg daily #90 with 3 rf

## 2018-09-20 NOTE — Telephone Encounter (Signed)
Copied from [place] [facility] [place] [phone]. Topic: Quick Communication - Rx Refill/Question >> Sep 20, 2018  9:57 AM Ahmed Prima L wrote: Medication: sertraline (ZOLOFT) 25 MG tablet & she states that the pharmacy told her that they never received the varenicline (CHANTIX STARTING MONTH PAK) 0.5 MG X 11 & 1 MG X 42 tablet, please advis  Has the patient contacted their pharmacy? Yes usually at mail order.  (Agent: If no, request that the patient contact the pharmacy for the refill.) (Agent: If yes, when and what did the pharmacy advise?)  Preferred Pharmacy (with phone number or street name): [id number] - [place], [place] [address] [place] [address] Phone: [phone] Fax: [phone]    Agent: Please be advised that RX refills may take up to 3 business days. We ask that you follow-up with your pharmacy.

## 2018-09-20 NOTE — Telephone Encounter (Signed)
Please call in a starter pack for Chantix and also Sertraline 50 mg daily, #90 with 3 rf

## 2018-09-20 NOTE — Addendum Note (Signed)
Addended by: Elie Confer on: 09/20/2018 02:14 PM   Modules accepted: Orders

## 2018-09-20 NOTE — Telephone Encounter (Signed)
chantix has been sent to the pharmacy but the pt is asking for the sertraline to the sent in as well. Pt stated that she is taking 50 mg  Daily.  Dr. Sarajane Jews please advise. Thanks

## 2018-09-24 DIAGNOSIS — R3915 Urgency of urination: Secondary | ICD-10-CM | POA: Diagnosis not present

## 2018-09-24 DIAGNOSIS — M6281 Muscle weakness (generalized): Secondary | ICD-10-CM | POA: Diagnosis not present

## 2018-09-24 DIAGNOSIS — N3946 Mixed incontinence: Secondary | ICD-10-CM | POA: Diagnosis not present

## 2018-09-25 DIAGNOSIS — L089 Local infection of the skin and subcutaneous tissue, unspecified: Secondary | ICD-10-CM | POA: Diagnosis not present

## 2018-10-01 DIAGNOSIS — R3915 Urgency of urination: Secondary | ICD-10-CM | POA: Diagnosis not present

## 2018-10-01 DIAGNOSIS — M6281 Muscle weakness (generalized): Secondary | ICD-10-CM | POA: Diagnosis not present

## 2018-10-01 DIAGNOSIS — N3946 Mixed incontinence: Secondary | ICD-10-CM | POA: Diagnosis not present

## 2018-10-03 ENCOUNTER — Other Ambulatory Visit: Payer: Self-pay | Admitting: Family Medicine

## 2018-10-03 MED ORDER — SERTRALINE HCL 50 MG PO TABS: 50.0000 mg | ORAL_TABLET | Freq: Every day | ORAL | 3 refills | Status: DC

## 2018-10-08 DIAGNOSIS — M6281 Muscle weakness (generalized): Secondary | ICD-10-CM | POA: Diagnosis not present

## 2018-10-08 DIAGNOSIS — N3946 Mixed incontinence: Secondary | ICD-10-CM | POA: Diagnosis not present

## 2018-10-17 ENCOUNTER — Encounter: Payer: Self-pay | Admitting: Family Medicine

## 2018-10-17 NOTE — Telephone Encounter (Signed)
We already sent in for 2 months of continuing packs to her pharmacy

## 2018-10-17 NOTE — Telephone Encounter (Signed)
Dr. Fry please advise. Thanks  

## 2018-10-23 ENCOUNTER — Encounter: Payer: Self-pay | Admitting: Plastic Surgery

## 2018-10-30 ENCOUNTER — Encounter: Payer: Self-pay | Admitting: Family Medicine

## 2018-10-30 MED ORDER — VARENICLINE TARTRATE 1 MG PO TABS: 1.0000 mg | ORAL_TABLET | Freq: Two times a day (BID) | ORAL | 1 refills | Status: DC

## 2018-11-12 ENCOUNTER — Encounter: Payer: Self-pay | Admitting: Plastic Surgery

## 2018-11-20 ENCOUNTER — Other Ambulatory Visit: Payer: Self-pay | Admitting: Family Medicine

## 2018-11-21 NOTE — Telephone Encounter (Signed)
Dr. Sarajane Jews please advise on refill of chantix.  Thanks

## 2018-11-21 NOTE — Telephone Encounter (Signed)
Call in one month supply with 2 rf

## 2019-01-16 ENCOUNTER — Other Ambulatory Visit: Payer: Self-pay | Admitting: Family Medicine

## 2019-01-20 NOTE — Telephone Encounter (Signed)
Make a Doxy visit since we haven't seen her in over a year

## 2019-01-20 NOTE — Telephone Encounter (Signed)
Dr. Fry please advise on refills. Thanks  

## 2019-01-24 ENCOUNTER — Telehealth: Payer: Self-pay

## 2019-01-24 ENCOUNTER — Encounter: Payer: Self-pay | Admitting: *Deleted

## 2019-01-24 NOTE — Telephone Encounter (Signed)
My chart message sent to the patient about an in office appt.

## 2019-01-24 NOTE — Telephone Encounter (Signed)
Copied from [place] [phone]. Topic: Referral - Request for Referral >> Jan 24, 2019 10:39 AM Rainey Pines A wrote: Has patient seen PCP for this complaint? Yes *If NO, is insurance requiring patient see PCP for this issue before PCP can refer them? Referral for which specialty: Otolaryngology Preferred provider/office: [place] of [place] and that is highly recommended Reason for referral: Blockage in right ear

## 2019-01-24 NOTE — Telephone Encounter (Signed)
Dr. Fry please advise. Thanks  

## 2019-01-24 NOTE — Telephone Encounter (Signed)
She will need an in person OV first

## 2019-01-27 ENCOUNTER — Ambulatory Visit: Payer: BLUE CROSS/BLUE SHIELD | Admitting: Family Medicine

## 2019-01-27 ENCOUNTER — Other Ambulatory Visit: Payer: Self-pay

## 2019-01-27 ENCOUNTER — Encounter: Payer: Self-pay | Admitting: Family Medicine

## 2019-01-27 VITALS — BP 122/60 | HR 73 | Temp 98.2°F | Ht 67.0 in | Wt 190.8 lb

## 2019-01-27 DIAGNOSIS — H6121 Impacted cerumen, right ear: Secondary | ICD-10-CM | POA: Diagnosis not present

## 2019-01-27 NOTE — Progress Notes (Signed)
   Subjective:    Patient ID: Tara Schroeder, female    DOB: 07-29-1961, 58 y.o.   MRN: [id number]  HPI Here for 2 weeks of fullness and decreased hearing in both ears. No pain. No URI symptoms. She has seen Dr. Lucia Gaskins a number of times in the past to have wax cleaned out.    Review of Systems  Constitutional: Negative.   HENT: Positive for hearing loss. Negative for congestion, ear discharge, ear pain, postnasal drip, sinus pressure, sinus pain and sore throat.   Eyes: Negative.   Respiratory: Negative.        Objective:   Physical Exam Constitutional:      Appearance: Normal appearance.  HENT:     Right Ear: External ear normal.     Left Ear: External ear normal.     Ears:     Comments: Both canals are full of cerumen     Nose: Nose normal.     Mouth/Throat:     Pharynx: Oropharynx is clear.  Eyes:     Conjunctiva/sclera: Conjunctivae normal.  Pulmonary:     Effort: Pulmonary effort is normal.     Breath sounds: Normal breath sounds.  Lymphadenopathy:     Cervical: No cervical adenopathy.  Neurological:     Mental Status: She is alert.           Assessment & Plan:  Cerumen impactions. We were able to clear out the left ear with water irrigation, but not the right ear. We will refer her to ENT for this.  Alysia Penna, MD

## 2019-01-28 DIAGNOSIS — L299 Pruritus, unspecified: Secondary | ICD-10-CM | POA: Diagnosis not present

## 2019-01-28 DIAGNOSIS — H938X1 Other specified disorders of right ear: Secondary | ICD-10-CM | POA: Diagnosis not present

## 2019-01-28 DIAGNOSIS — H6121 Impacted cerumen, right ear: Secondary | ICD-10-CM | POA: Diagnosis not present

## 2019-02-18 ENCOUNTER — Telehealth: Payer: Self-pay | Admitting: Plastic Surgery

## 2019-02-18 NOTE — Telephone Encounter (Signed)
Left voicemail for patient to return call. Inquiring about surgery to be scheduled.

## 2019-02-19 ENCOUNTER — Encounter: Payer: Self-pay | Admitting: Family Medicine

## 2019-03-21 ENCOUNTER — Other Ambulatory Visit: Payer: Self-pay

## 2019-03-21 ENCOUNTER — Ambulatory Visit (INDEPENDENT_AMBULATORY_CARE_PROVIDER_SITE_OTHER): Payer: BC Managed Care – PPO | Admitting: Family Medicine

## 2019-03-21 ENCOUNTER — Encounter: Payer: Self-pay | Admitting: Family Medicine

## 2019-03-21 VITALS — BP 120/80 | HR 73 | Temp 98.5°F | Wt 190.6 lb

## 2019-03-21 DIAGNOSIS — Z0000 Encounter for general adult medical examination without abnormal findings: Secondary | ICD-10-CM

## 2019-03-21 DIAGNOSIS — E039 Hypothyroidism, unspecified: Secondary | ICD-10-CM | POA: Diagnosis not present

## 2019-03-21 DIAGNOSIS — Z1321 Encounter for screening for nutritional disorder: Secondary | ICD-10-CM | POA: Diagnosis not present

## 2019-03-21 LAB — POC URINALSYSI DIPSTICK (AUTOMATED)
Bilirubin, UA: NEGATIVE
Glucose, UA: NEGATIVE
Ketones, UA: NEGATIVE
Leukocytes, UA: NEGATIVE
Nitrite, UA: NEGATIVE
Protein, UA: NEGATIVE
Spec Grav, UA: 1.015 (ref 1.010–1.025)
Urobilinogen, UA: 0.2 E.U./dL
pH, UA: 6 (ref 5.0–8.0)

## 2019-03-21 MED ORDER — GENTAMICIN SULFATE 0.1 % EX OINT: TOPICAL_OINTMENT | CUTANEOUS | 1 refills | Status: DC

## 2019-03-21 NOTE — Progress Notes (Signed)
   Subjective:    Patient ID: Tara Schroeder, female    DOB: Sep 30, 1960, 58 y.o.   MRN: [id number]  HPI Here for a well exam. She feels well. She is scheduled to have surgery per Dr. Audelia Hives in mid September to remove a retained suture from her umbilical area. She is currently applying Gentamicin ointment to the area BID.    Review of Systems  Constitutional: Negative.   HENT: Negative.   Eyes: Negative.   Respiratory: Negative.   Cardiovascular: Negative.   Gastrointestinal: Negative.   Genitourinary: Negative for decreased urine volume, difficulty urinating, dyspareunia, dysuria, enuresis, flank pain, frequency, hematuria, pelvic pain and urgency.  Musculoskeletal: Negative.   Skin: Negative.   Neurological: Negative.   Psychiatric/Behavioral: Negative.        Objective:   Physical Exam Constitutional:      General: She is not in acute distress.    Appearance: She is well-developed.  HENT:     Head: Normocephalic and atraumatic.     Right Ear: External ear normal.     Left Ear: External ear normal.     Nose: Nose normal.     Mouth/Throat:     Pharynx: No oropharyngeal exudate.  Eyes:     General: No scleral icterus.    Conjunctiva/sclera: Conjunctivae normal.     Pupils: Pupils are equal, round, and reactive to light.  Neck:     Musculoskeletal: Normal range of motion and neck supple.     Thyroid: No thyromegaly.     Vascular: No JVD.  Cardiovascular:     Rate and Rhythm: Normal rate and regular rhythm.     Heart sounds: Normal heart sounds. No murmur. No friction rub. No gallop.   Pulmonary:     Effort: Pulmonary effort is normal. No respiratory distress.     Breath sounds: Normal breath sounds. No wheezing or rales.  Chest:     Chest wall: No tenderness.  Abdominal:     General: Bowel sounds are normal. There is no distension.     Palpations: Abdomen is soft. There is no mass.     Tenderness: There is no abdominal tenderness. There is no guarding  or rebound.  Musculoskeletal: Normal range of motion.        General: No tenderness.  Lymphadenopathy:     Cervical: No cervical adenopathy.  Skin:    General: Skin is warm and dry.     Findings: No erythema or rash.  Neurological:     Mental Status: She is alert and oriented to person, place, and time.     Cranial Nerves: No cranial nerve deficit.     Motor: No abnormal muscle tone.     Coordination: Coordination normal.     Deep Tendon Reflexes: Reflexes are normal and symmetric. Reflexes normal.  Psychiatric:        Behavior: Behavior normal.        Thought Content: Thought content normal.        Judgment: Judgment normal.           Assessment & Plan:  Well exam. We discussed diet and exercise. Get fasting labs.  Alysia Penna, MD

## 2019-03-22 LAB — HEPATIC FUNCTION PANEL
AG Ratio: 2 (calc) (ref 1.0–2.5)
ALT: 20 U/L (ref 6–29)
AST: 17 U/L (ref 10–35)
Albumin: 4.6 g/dL (ref 3.6–5.1)
Alkaline phosphatase (APISO): 67 U/L (ref 37–153)
Bilirubin, Direct: 0.1 mg/dL (ref 0.0–0.2)
Globulin: 2.3 g/dL (calc) (ref 1.9–3.7)
Indirect Bilirubin: 0.4 mg/dL (calc) (ref 0.2–1.2)
Total Bilirubin: 0.5 mg/dL (ref 0.2–1.2)
Total Protein: 6.9 g/dL (ref 6.1–8.1)

## 2019-03-22 LAB — BASIC METABOLIC PANEL
BUN: 16 mg/dL (ref 7–25)
CO2: 26 mmol/L (ref 20–32)
Calcium: 10 mg/dL (ref 8.6–10.4)
Chloride: 103 mmol/L (ref 98–110)
Creat: 0.61 mg/dL (ref 0.50–1.05)
Glucose, Bld: 79 mg/dL (ref 65–99)
Potassium: 4 mmol/L (ref 3.5–5.3)
Sodium: 141 mmol/L (ref 135–146)

## 2019-03-22 LAB — CBC WITH DIFFERENTIAL/PLATELET
Absolute Monocytes: 307 cells/uL (ref 200–950)
Basophils Absolute: 41 cells/uL (ref 0–200)
Basophils Relative: 0.7 %
Eosinophils Absolute: 406 cells/uL (ref 15–500)
Eosinophils Relative: 7 %
HCT: 40.8 % (ref 35.0–45.0)
Hemoglobin: 14 g/dL (ref 11.7–15.5)
Lymphs Abs: 1984 cells/uL (ref 850–3900)
MCH: 30.7 pg (ref 27.0–33.0)
MCHC: 34.3 g/dL (ref 32.0–36.0)
MCV: 89.5 fL (ref 80.0–100.0)
MPV: 11.4 fL (ref 7.5–12.5)
Monocytes Relative: 5.3 %
Neutro Abs: 3062 cells/uL (ref 1500–7800)
Neutrophils Relative %: 52.8 %
Platelets: 186 10*3/uL (ref 140–400)
RBC: 4.56 10*6/uL (ref 3.80–5.10)
RDW: 12.8 % (ref 11.0–15.0)
Total Lymphocyte: 34.2 %
WBC: 5.8 10*3/uL (ref 3.8–10.8)

## 2019-03-22 LAB — T3, FREE: T3, Free: 2.9 pg/mL (ref 2.3–4.2)

## 2019-03-22 LAB — LIPID PANEL
Cholesterol: 178 mg/dL (ref <200)
HDL: 52 mg/dL (ref >=50)
LDL Cholesterol (Calc): 107 mg/dL (calc) — ABNORMAL HIGH
Non-HDL Cholesterol (Calc): 126 mg/dL (calc) (ref <130)
Total CHOL/HDL Ratio: 3.4 (calc) (ref <5.0)
Triglycerides: 93 mg/dL (ref <150)

## 2019-03-22 LAB — T4, FREE: Free T4: 1.2 ng/dL (ref 0.8–1.8)

## 2019-03-22 LAB — TSH: TSH: 0.46 mIU/L (ref 0.40–4.50)

## 2019-03-22 LAB — VITAMIN B12: Vitamin B-12: 346 pg/mL (ref 200–1100)

## 2019-04-03 ENCOUNTER — Telehealth: Payer: Self-pay

## 2019-04-03 ENCOUNTER — Telehealth: Payer: Self-pay | Admitting: Plastic Surgery

## 2019-04-03 NOTE — Telephone Encounter (Signed)
Received call from patient regarding cosmetic quote for surgery and some confusion based on estimated costs combined with the insurance portion of her surgery booking. The quote given was based originally 3.5 hours, but with the case combined, the cosmetic fees are being estimated on 2.5 hours. Once the insurance portion of the surgery is complete, we will bill the insurance for that portion of the surgery only. We will not bill the patient for that portion until after the surgery, as the deductible will likely be collected by the OR ([facility]) and anesthesia entities. The patient understood better, but was still somewhat confused on expectation of overall cost.

## 2019-04-03 NOTE — Telephone Encounter (Signed)

## 2019-04-04 ENCOUNTER — Encounter: Payer: Self-pay | Admitting: Plastic Surgery

## 2019-04-04 ENCOUNTER — Ambulatory Visit (INDEPENDENT_AMBULATORY_CARE_PROVIDER_SITE_OTHER): Payer: BC Managed Care – PPO | Admitting: Plastic Surgery

## 2019-04-04 ENCOUNTER — Other Ambulatory Visit: Payer: Self-pay

## 2019-04-04 VITALS — BP 113/76 | HR 69 | Temp 97.9°F | Ht 67.0 in | Wt 187.0 lb

## 2019-04-04 DIAGNOSIS — T8189XA Other complications of procedures, not elsewhere classified, initial encounter: Secondary | ICD-10-CM

## 2019-04-04 DIAGNOSIS — Z189 Retained foreign body fragments, unspecified material: Secondary | ICD-10-CM

## 2019-04-04 DIAGNOSIS — Z719 Counseling, unspecified: Secondary | ICD-10-CM | POA: Insufficient documentation

## 2019-04-04 MED ORDER — HYDROCODONE-ACETAMINOPHEN 5-325 MG PO TABS: 1.0000 | ORAL_TABLET | Freq: Three times a day (TID) | ORAL | 0 refills | Status: AC | PRN

## 2019-04-04 MED ORDER — ONDANSETRON HCL 4 MG PO TABS: 4.0000 mg | ORAL_TABLET | Freq: Three times a day (TID) | ORAL | 0 refills | Status: AC | PRN

## 2019-04-04 MED ORDER — CEPHALEXIN 500 MG PO CAPS: 500.0000 mg | ORAL_CAPSULE | Freq: Four times a day (QID) | ORAL | 0 refills | Status: AC

## 2019-04-04 NOTE — Progress Notes (Signed)
Patient ID: SEVERA SCHMAL, female    DOB: 12/31/1960, 58 y.o.   MRN: [id number]   Chief Complaint  Patient presents with  . Advice Only    The patient is a 58 yrs old wf here for her preoperative history and physical for abdominal surgery.  She has had pain in her umbilicus area for the past several years.  She had a laparoscopic procedure for a cholecystectomy and appendectomy.  She is also been treated for endometriosis since then she has had drainage from her umbilical area with a bad odor.  Nothing seems to make it better.  It is tender.  She does not have any sign of a hernia or diastases.  Our concern is regarding a retained suture.  She has also decided to have a abdominoplasty at the same time with lateral liposuction.   Review of Systems  Constitutional: Negative for activity change and appetite change.  HENT: Negative.   Eyes: Negative.   Respiratory: Negative for chest tightness and shortness of breath.   Cardiovascular: Negative for leg swelling.  Gastrointestinal: Positive for abdominal pain and constipation. Negative for diarrhea.  Genitourinary: Negative.   Musculoskeletal: Negative.   Skin: Positive for color change and wound.  Neurological: Negative.   Hematological: Negative.   Psychiatric/Behavioral: Negative.     Past Medical History:  Diagnosis Date  . Arthritis   . Endometriosis    sees Dr. Kathryne Eriksson   . Fibroids   . Hyperlipidemia   . Migraines   . Obesity     Past Surgical History:  Procedure Laterality Date  . APPENDECTOMY  2010  . CHOLECYSTECTOMY  2010  . CHOLECYSTECTOMY, LAPAROSCOPIC  2010  . COLONOSCOPY  06/2018    per Dr. Carol Ada, benign polyps, repeat in 10 yrs   . DILATATION & CURETTAGE/HYSTEROSCOPY WITH MYOSURE N/A 07/10/2014   Procedure: DILATATION & CURETTAGE/HYSTEROSCOPY;  Surgeon: Marylynn Pearson, MD;  Location: [facility];  Service: Gynecology;  Laterality: N/A;  . ENDOMETRIAL ABLATION  2008  . TOTAL KNEE ARTHROPLASTY      Right  . Uterine ablation        Current Outpatient Medications:  .  Calcium Carbonate-Vit D-Min (CALCIUM 1200 PO), Take by mouth., Disp: , Rfl:  .  gentamicin ointment (GARAMYCIN) 0.1 %, 1 (ONE) APPLICATION APPLY TO ABDOMEN TWICE DAILY, Disp: 15 g, Rfl: 1 .  levothyroxine (SYNTHROID, LEVOTHROID) 100 MCG tablet, Take 1 tablet (100 mcg total) by mouth daily., Disp: 90 tablet, Rfl: 3 .  Multiple Vitamin (MULTIVITAMIN) capsule, Take 1 capsule daily by mouth., Disp: , Rfl:  .  polyethylene glycol (MIRALAX / GLYCOLAX) packet, Take 17 g by mouth 3 (three) times a week., Disp: , Rfl:  .  sertraline (ZOLOFT) 50 MG tablet, Take 1 tablet (50 mg total) by mouth daily., Disp: 90 tablet, Rfl: 3   Objective:   Vitals:   04/04/19 1115  BP: 113/76  Pulse: 69  Temp: 97.9 F (36.6 C)  SpO2: 98%    Physical Exam Vitals signs and nursing note reviewed.  Constitutional:      Appearance: Normal appearance.  HENT:     Head: Normocephalic and atraumatic.     Right Ear: External ear normal.     Left Ear: External ear normal.  Eyes:     Extraocular Movements: Extraocular movements intact.  Neck:     Musculoskeletal: Normal range of motion.  Cardiovascular:     Rate and Rhythm: Normal rate.  Pulmonary:  Effort: Pulmonary effort is normal. No respiratory distress.  Abdominal:     General: Abdomen is flat. There is no distension.     Tenderness: There is abdominal tenderness. There is no guarding or rebound.  Skin:    General: Skin is warm.  Neurological:     General: No focal deficit present.     Mental Status: She is alert and oriented to person, place, and time.  Psychiatric:        Mood and Affect: Mood normal.        Behavior: Behavior normal.        Thought Content: Thought content normal.     Assessment & Plan:  Retained suture, initial encounter  Encounter for counseling  The risk that can be encountered for this procedure were discussed and include the following but not  limited to these: asymmetry, fluid accumulation, firmness of the tissue, skin loss, decrease or no sensation, fat necrosis, bleeding, infection, healing delay.  Deep vein thrombosis, cardiac and pulmonary complications are risks to any procedure.  There are risks of anesthesia, changes to skin sensation and injury to nerves or blood vessels.  The muscle can be temporarily or permanently injured.  You may have an allergic reaction to tape, suture, glue, blood products which can result in skin discoloration, swelling, pain, skin lesions, poor healing.  Any of these can lead to the need for revisonal surgery or stage procedures.  Weight gain and weigh loss can also effect the long term appearance. The results are not guaranteed to last a lifetime.  Future surgery may be required.   I have reiterated to her that this surgery is very painful due to my postop check.  We are planning on abdominoplasty excision of umbilical lesion and liposuction.  Prescriptions were sent to pharmacy.    [place], DO

## 2019-04-17 ENCOUNTER — Other Ambulatory Visit: Payer: Self-pay | Admitting: Family Medicine

## 2019-04-17 DIAGNOSIS — Z719 Counseling, unspecified: Secondary | ICD-10-CM

## 2019-04-17 DIAGNOSIS — T8183XA Persistent postprocedural fistula, initial encounter: Secondary | ICD-10-CM | POA: Diagnosis not present

## 2019-04-17 DIAGNOSIS — Z4802 Encounter for removal of sutures: Secondary | ICD-10-CM | POA: Diagnosis not present

## 2019-04-24 ENCOUNTER — Telehealth: Payer: Self-pay

## 2019-04-24 NOTE — Progress Notes (Signed)
   Subjective:     Patient ID: Tara Schroeder, female    DOB: 1960-08-19, 58 y.o.   MRN: [id number]  Chief Complaint  Patient presents with  . Post-op Follow-up    for exploration of umbillcus for excision of retained suture/abdominoplasty    HPI: The patient is a 58 y.o. female here for follow-up after abdominoplasty with lateral liposuction and exploration of umbilicus for removal removal of retained sutures.  She is approximately 1 week postop.  She reports that she has been doing well.  She does not have any complaints today other than some mild pain.  The majority of the pain occurs with extension or flexion of her back.  Her JP drains have had approximately 30 to 40 cc output of serosanguineous fluid over the last few days.  She does not have any fever, chills, nausea, vomiting.  She does not have any shortness of breath, chest pain, leg pain.  She has been wearing the abdominal binder since surgery and has not removed it.  Topi foam still in place.  Minimal drainage noted on ABD pads.  She is very pleased with her progress.  She has mild abdominal bruising, which is beginning to yellow.  Review of Systems  Constitutional: Negative for chills, diaphoresis, fever and malaise/fatigue.       Positive activity change   Respiratory: Negative.   Cardiovascular: Negative.   Gastrointestinal: Positive for abdominal pain. Negative for diarrhea, nausea and vomiting.  Musculoskeletal: Negative for myalgias.  Skin: Negative for itching and rash.  Neurological: Positive for sensory change.     Objective:   Vital Signs BP 110/72 (BP Location: Left Arm, Patient Position: Sitting, Cuff Size: Normal)   Pulse 63   Temp (!) 97.5 F (36.4 C) (Temporal)   Ht 5\' 7"  (1.702 m)   Wt 181 lb 9.6 oz (82.4 kg)   SpO2 98%   BMI 28.44 kg/m  Vital Signs and Nursing Note Reviewed Chaperone present Physical Exam  Constitutional: She is oriented to person, place, and time and well-developed,  well-nourished, and in no distress.  HENT:  Head: Normocephalic and atraumatic.  Neck: Normal range of motion.  Cardiovascular: Normal rate.  Pulmonary/Chest: Effort normal.  Abdominal: Soft. Bowel sounds are normal. She exhibits no distension. There is abdominal tenderness. There is no guarding.    Musculoskeletal: Normal range of motion.        General: No tenderness, deformity or edema.  Neurological: She is alert and oriented to person, place, and time. Gait normal.  Skin: Skin is warm and dry. No rash noted. She is not diaphoretic. No erythema. No pallor.  Psychiatric: Mood and affect normal.      Assessment/Plan:     ICD-10-CM   1. Retained suture, subsequent encounter  T81.89XD    Z18.9   2. Open wound of umbilical region, subsequent encounter  S31.105D     Overall Tara Schroeder is doing really well.  She is receiving assistance at home from her husband.  She has had mild pain.  She does not have any concerns at this time.  All of her questions were answered.  Follow-up next week for removal of JP drains depending on output over the next few days.  Continue to eat healthy, drink plenty fluids, rest, take a multivitamin.  Continue wearing abdominal binder with topi foam.  Call with any questions or concerns.   Carola Rhine Jamira Barfuss, PA-C 04/25/2019, 1:10 PM

## 2019-04-24 NOTE — Telephone Encounter (Signed)

## 2019-04-25 ENCOUNTER — Encounter: Payer: Self-pay | Admitting: Surgical

## 2019-04-25 ENCOUNTER — Ambulatory Visit (INDEPENDENT_AMBULATORY_CARE_PROVIDER_SITE_OTHER): Payer: BC Managed Care – PPO | Admitting: Surgical

## 2019-04-25 ENCOUNTER — Other Ambulatory Visit: Payer: Self-pay

## 2019-04-25 VITALS — BP 110/72 | HR 63 | Temp 97.5°F | Ht 67.0 in | Wt 181.6 lb

## 2019-04-25 DIAGNOSIS — T8189XD Other complications of procedures, not elsewhere classified, subsequent encounter: Secondary | ICD-10-CM

## 2019-04-25 DIAGNOSIS — S31105D Unspecified open wound of abdominal wall, periumbilic region without penetration into peritoneal cavity, subsequent encounter: Secondary | ICD-10-CM

## 2019-04-25 DIAGNOSIS — Z189 Retained foreign body fragments, unspecified material: Secondary | ICD-10-CM

## 2019-04-29 ENCOUNTER — Ambulatory Visit: Payer: BC Managed Care – PPO | Admitting: Surgical

## 2019-04-30 ENCOUNTER — Telehealth: Payer: Self-pay

## 2019-04-30 NOTE — Telephone Encounter (Signed)

## 2019-05-01 ENCOUNTER — Other Ambulatory Visit: Payer: Self-pay

## 2019-05-01 ENCOUNTER — Encounter: Payer: Self-pay | Admitting: Surgical

## 2019-05-01 ENCOUNTER — Ambulatory Visit (INDEPENDENT_AMBULATORY_CARE_PROVIDER_SITE_OTHER): Payer: BC Managed Care – PPO | Admitting: Surgical

## 2019-05-01 VITALS — BP 110/73 | HR 63 | Temp 97.5°F | Ht 67.0 in | Wt 181.0 lb

## 2019-05-01 DIAGNOSIS — T8189XD Other complications of procedures, not elsewhere classified, subsequent encounter: Secondary | ICD-10-CM

## 2019-05-01 DIAGNOSIS — S31105D Unspecified open wound of abdominal wall, periumbilic region without penetration into peritoneal cavity, subsequent encounter: Secondary | ICD-10-CM

## 2019-05-01 DIAGNOSIS — Z189 Retained foreign body fragments, unspecified material: Secondary | ICD-10-CM

## 2019-05-01 NOTE — Progress Notes (Signed)
Subjective:     Patient ID: Tara Schroeder, female    DOB: [birth year], 58 y.o.   MRN: [id number]  Chief Complaint  Patient presents with  . Follow-up    1 week for abdominoplasty w/ lipo    HPI: The patient is a 58 y.o. female here for follow-up after abdominoplasty with lateral liposuction exploration of umbilicus for removal of retained sutures.  She is now 2 weeks postop.  She has been doing really well.  She has been wearing her abdominal binder with topi foam.  She reports that her drain output is approximately 20 to 25 cc daily.  It is serosanguineous in nature.  She denies any fever, chills, nausea, vomiting.  She has been active at home.   She reports the pain is improved and only painful with pressure or excessive movement.  She is happy with her progress.  Incisions are all C/D/I, no dehiscence noted,no sign of infection, no surrounding erythema. No drainage.  Review of Systems  Constitutional: Negative for chills, diaphoresis, fever, malaise/fatigue and weight loss.       Activity change(+)   Respiratory: Negative for cough, shortness of breath and wheezing.   Cardiovascular: Negative.  Negative for chest pain, palpitations and leg swelling.  Gastrointestinal: Negative.   Genitourinary: Negative.   Musculoskeletal: Negative for myalgias.  Skin: Negative for itching and rash.  Neurological: Positive for sensory change. Negative for dizziness, focal weakness, weakness and headaches.  Psychiatric/Behavioral: Negative.      Objective:   Vital Signs BP 110/73 (BP Location: Left Arm, Patient Position: Sitting, Cuff Size: Normal)   Pulse 63   Temp (!) 97.5 F (36.4 C) (Temporal)   Ht 5\' 7"  (1.702 m)   Wt 181 lb (82.1 kg)   SpO2 97%   BMI 28.35 kg/m  Vital Signs and Nursing Note Reviewed Chaperone present Physical Exam  Constitutional: She is oriented to person, place, and time and well-developed, well-nourished, and in no distress.  HENT:  Head: Normocephalic  and atraumatic.  Cardiovascular: Normal rate.  Pulmonary/Chest: Effort normal.  Abdominal: Soft. Bowel sounds are normal. She exhibits no distension and no mass. There is abdominal tenderness. There is no rebound and no guarding.    Abdominal incision is C/D/I.  No dehiscence noted.  Dermabond in place.  No surrounding erythema, drainage.  Still having slight abdominal bruising which is yellow  Musculoskeletal: Normal range of motion.  Neurological: She is alert and oriented to person, place, and time. Gait normal.  Skin: Skin is warm and dry. No rash noted. She is not diaphoretic. No erythema. No pallor.  Psychiatric: Mood and affect normal.      Assessment/Plan:     ICD-10-CM   1. Retained suture, subsequent encounter  T81.89XD    Z18.9   2. Open wound of umbilical region, subsequent encounter  S31.105D     JP drain removed due to low output.  Vaseline and gauze placed over JP drain insertion sites.  She can shower tomorrow.  Can continue to eat a healthy well-balanced diet, high-protein, low carbs/sugar.  Multivitamin, vitamin C.  Drink plenty of water.  Continue to wear topi foam and abdominal binder to decrease pain and swelling.  If she would like she can begin wearing spanks instead of abdominal binder.  Abdominal binder would be preferred, but if it is too much pressure she can switch to spanks or alternate depending on activity.  Follow-up in 3 weeks.  Carola Rhine Diogenes Whirley, PA-C 05/01/2019, 10:28 AM

## 2019-05-08 ENCOUNTER — Telehealth: Payer: Self-pay

## 2019-05-08 NOTE — Telephone Encounter (Signed)
Received call from patient wanting to know if she should continue to wear the abdominal binder. She also states that her stomach is still hard. She is requesting a call back to discuss further.

## 2019-05-08 NOTE — Telephone Encounter (Signed)
Called to speak with Tara Schroeder.  She had some questions about wearing the abdominal binder versus spanks.  She can start wearing spanks throughout the day and at night to help with pain and compression.  She also notes that her abdomen feels a little tight compared to last week.  She does not have any fever, chills, nausea, vomiting.  Incision is still intact.  It is possible that she has some swelling after drain removal and this should resolve over the next week or so.  She is welcome to come into the clinic if things do not improve and she knows to call if things worsen or she begins to not feel well.  Overall she says she feels well she does wanted to make sure that this tightness in her abdomen was not anything to worry about.

## 2019-05-23 ENCOUNTER — Ambulatory Visit (INDEPENDENT_AMBULATORY_CARE_PROVIDER_SITE_OTHER): Payer: BC Managed Care – PPO | Admitting: Surgical

## 2019-05-23 ENCOUNTER — Encounter: Payer: Self-pay | Admitting: Surgical

## 2019-05-23 ENCOUNTER — Other Ambulatory Visit: Payer: Self-pay

## 2019-05-23 VITALS — BP 114/76 | HR 74 | Temp 98.4°F | Ht 67.0 in | Wt 178.0 lb

## 2019-05-23 DIAGNOSIS — Z189 Retained foreign body fragments, unspecified material: Secondary | ICD-10-CM

## 2019-05-23 DIAGNOSIS — Z719 Counseling, unspecified: Secondary | ICD-10-CM

## 2019-05-23 DIAGNOSIS — T8189XD Other complications of procedures, not elsewhere classified, subsequent encounter: Secondary | ICD-10-CM

## 2019-05-23 NOTE — Progress Notes (Signed)
   Subjective:     Patient ID: Tara Schroeder, female    DOB: Feb 24, 1961, 58 y.o.   MRN: [id number]  Chief Complaint  Patient presents with  . Follow-up    HPI: The patient is a 58 y.o. female here for follow-up abdominoplasty with lateral liposuction exploration of umbilicus for removal of retained sutures.  5 weeks post-op at this time.  She reports overall, she is doing very well. She still has some mild swelling of her abdomen, mostly R sided. Incisions are healing well, no dehiscence noted. C/d/i. No erythema noted.  She is eating well, drinking well and walking without issue. + voiding and BM. She has been wearing a back brace for compression which has been helpful.   Her pain has been mild, mostly tightness and sharp feeling.   Denies any fever, chills, n/v.  Review of Systems  Constitutional: Positive for activity change. Negative for appetite change, chills, diaphoresis, fatigue and fever.  Respiratory: Negative for shortness of breath.   Cardiovascular: Negative for chest pain and leg swelling.  Gastrointestinal: Positive for abdominal pain (mild - intermittent). Negative for diarrhea, nausea and vomiting.  Genitourinary: Negative for dysuria.  Skin: Negative for color change, pallor, rash and wound.  Neurological: Negative for dizziness, weakness, light-headedness, numbness and headaches.    Objective:   Vital Signs BP 114/76 (BP Location: Left Arm, Patient Position: Sitting, Cuff Size: Normal)   Pulse 74   Temp 98.4 F (36.9 C) (Temporal)   Ht 5\' 7"  (1.702 m)   Wt 178 lb (80.7 kg)   SpO2 95%   BMI 27.88 kg/m  Vital Signs and Nursing Note Reviewed Chaperone present Physical Exam  Constitutional: She is oriented to person, place, and time and well-developed, well-nourished, and in no distress.  HENT:  Head: Normocephalic and atraumatic.  Cardiovascular: Normal rate.  Pulmonary/Chest: Effort normal.  Abdominal: Soft. Normal appearance. There is abdominal  tenderness (minimal).    Abdominoplasty incision c/d/i. No drainage noted, no open wounds or dehiscence. Healing well. Some areas of fibrosis/scarring noted over midline on palpation.    Musculoskeletal: Normal range of motion.  Neurological: She is alert and oriented to person, place, and time. Gait normal.  Skin: Skin is warm and dry. No rash noted. She is not diaphoretic. No erythema. No pallor.  Psychiatric: Mood and affect normal.      Assessment/Plan:     ICD-10-CM   1. Retained suture, subsequent encounter  T81.89XD    Z18.9   2. Encounter for counseling  Z71.9    Mrs. brasier is doing really well. Her incisions are healing nicely and she has no sign of dehiscence, infection. Slightly swollen on right abdomen, but it is minimal.   Continue wearing binder throughout day. This will help decrease swelling.   Avoid heavy lifting.  Continue to eat healthy diet, low carb/sugar.  Massage areas of scarring/fibrosis. Sutures removed. Mederma cream in 1-2 weeks.  Follow up in 5-6 weeks.  Carola Rhine Randall Rampersad, PA-C 05/23/2019, 12:21 PM

## 2019-07-08 ENCOUNTER — Encounter: Payer: Self-pay | Admitting: Plastic Surgery

## 2019-07-08 ENCOUNTER — Other Ambulatory Visit: Payer: Self-pay

## 2019-07-08 ENCOUNTER — Ambulatory Visit (INDEPENDENT_AMBULATORY_CARE_PROVIDER_SITE_OTHER): Payer: BC Managed Care – PPO | Admitting: Plastic Surgery

## 2019-07-08 VITALS — BP 124/82 | HR 76 | Temp 97.3°F | Ht 67.0 in | Wt 179.2 lb

## 2019-07-08 DIAGNOSIS — Z189 Retained foreign body fragments, unspecified material: Secondary | ICD-10-CM

## 2019-07-08 DIAGNOSIS — Z719 Counseling, unspecified: Secondary | ICD-10-CM

## 2019-07-08 DIAGNOSIS — T8189XD Other complications of procedures, not elsewhere classified, subsequent encounter: Secondary | ICD-10-CM

## 2019-07-08 NOTE — Progress Notes (Signed)
   Subjective:    Patient ID: Tara Schroeder, female    DOB: [birth year], 58 y.o.   MRN: [id number]  The patient is a 57 year old female here for follow-up after undergoing excision of a umbilical lesion.  And panniculectomy.  She is very pleased with her results.  She has some firmness along the incision line.  This is mostly either fat necrosis or still suture retention.  There is no sign of hematoma or seroma.  Her incisions are all healing very nicely.  She complains of some pain with some movement but overall is doing well.  She is also starting to get the feeling back in the upper abdominal area.  She is extremely pleased and has no complaints otherwise.  She has had a 1 pound weight gain since the surgery not including what was taken off.   Review of Systems  Constitutional: Negative.   HENT: Negative.   Eyes: Negative.   Respiratory: Negative.   Cardiovascular: Negative.   Gastrointestinal: Negative.   Endocrine: Negative.   Genitourinary: Negative.   Musculoskeletal: Negative.   Hematological: Negative.        Objective:   Physical Exam Vitals signs and nursing note reviewed.  Constitutional:      Appearance: Normal appearance.  HENT:     Head: Normocephalic and atraumatic.  Cardiovascular:     Rate and Rhythm: Normal rate.     Pulses: Normal pulses.  Pulmonary:     Effort: Pulmonary effort is normal.  Abdominal:     General: Abdomen is flat. There is no distension.     Tenderness: There is no abdominal tenderness.  Neurological:     General: No focal deficit present.     Mental Status: She is alert. Mental status is at baseline.  Psychiatric:        Mood and Affect: Mood normal.        Behavior: Behavior normal.        Thought Content: Thought content normal.        Assessment & Plan:     ICD-10-CM   1. Encounter for counseling  Z71.9   2. Retained suture, subsequent encounter  T81.89XD    Z18.9    Massage to the incision line to help decrease the  firmness along the incision.  Also recommend increasing vegetables and decreasing carbohydrates and fats. Pictures were obtained of the patient and placed in the chart with the patient's or guardian's permission.

## 2019-08-01 HISTORY — PX: VARICOSE VEIN SURGERY: SHX832

## 2019-09-28 ENCOUNTER — Other Ambulatory Visit: Payer: Self-pay | Admitting: Family Medicine

## 2019-11-11 DIAGNOSIS — I8312 Varicose veins of left lower extremity with inflammation: Secondary | ICD-10-CM | POA: Diagnosis not present

## 2019-11-11 DIAGNOSIS — I8311 Varicose veins of right lower extremity with inflammation: Secondary | ICD-10-CM | POA: Diagnosis not present

## 2019-12-05 DIAGNOSIS — I8312 Varicose veins of left lower extremity with inflammation: Secondary | ICD-10-CM | POA: Diagnosis not present

## 2019-12-05 DIAGNOSIS — I8311 Varicose veins of right lower extremity with inflammation: Secondary | ICD-10-CM | POA: Diagnosis not present

## 2020-01-05 ENCOUNTER — Encounter: Payer: Self-pay | Admitting: Family Medicine

## 2020-01-06 ENCOUNTER — Ambulatory Visit: Payer: BC Managed Care – PPO | Admitting: Family Medicine

## 2020-01-06 NOTE — Telephone Encounter (Signed)
Dr. Jari Pigg

## 2020-01-07 DIAGNOSIS — L570 Actinic keratosis: Secondary | ICD-10-CM | POA: Diagnosis not present

## 2020-01-07 DIAGNOSIS — D485 Neoplasm of uncertain behavior of skin: Secondary | ICD-10-CM | POA: Diagnosis not present

## 2020-01-07 DIAGNOSIS — L821 Other seborrheic keratosis: Secondary | ICD-10-CM | POA: Diagnosis not present

## 2020-01-07 DIAGNOSIS — L578 Other skin changes due to chronic exposure to nonionizing radiation: Secondary | ICD-10-CM | POA: Diagnosis not present

## 2020-01-07 DIAGNOSIS — D225 Melanocytic nevi of trunk: Secondary | ICD-10-CM | POA: Diagnosis not present

## 2020-01-15 DIAGNOSIS — Z6830 Body mass index (BMI) 30.0-30.9, adult: Secondary | ICD-10-CM | POA: Diagnosis not present

## 2020-01-15 DIAGNOSIS — Z01419 Encounter for gynecological examination (general) (routine) without abnormal findings: Secondary | ICD-10-CM | POA: Diagnosis not present

## 2020-01-15 DIAGNOSIS — Z1231 Encounter for screening mammogram for malignant neoplasm of breast: Secondary | ICD-10-CM | POA: Diagnosis not present

## 2020-03-03 ENCOUNTER — Ambulatory Visit: Payer: BC Managed Care – PPO | Admitting: Family Medicine

## 2020-03-03 ENCOUNTER — Encounter: Payer: Self-pay | Admitting: Family Medicine

## 2020-03-03 ENCOUNTER — Other Ambulatory Visit: Payer: Self-pay

## 2020-03-03 VITALS — BP 122/62 | HR 73 | Temp 98.7°F | Wt 189.6 lb

## 2020-03-03 DIAGNOSIS — M109 Gout, unspecified: Secondary | ICD-10-CM | POA: Diagnosis not present

## 2020-03-03 MED ORDER — COLCHICINE 0.6 MG PO CAPS: 1.0000 | ORAL_CAPSULE | ORAL | 2 refills | Status: DC | PRN

## 2020-03-03 NOTE — Progress Notes (Signed)
   Subjective:    Patient ID: Tara Schroeder, female    DOB: Jan 01, 1961, 59 y.o.   MRN: [id number]  HPI Here for one week of pain and swelling in the right foot. No recent trauma. This started in the great toe and then it spread to the forefoot. Tylenol does not help. This has happened only once, many years ago.    Review of Systems  Constitutional: Negative.   Respiratory: Negative.   Cardiovascular: Negative.   Musculoskeletal: Positive for arthralgias.       Objective:   Physical Exam Constitutional:      Comments: In pain, limping   Cardiovascular:     Rate and Rhythm: Normal rate and regular rhythm.     Pulses: Normal pulses.     Heart sounds: Normal heart sounds.  Pulmonary:     Effort: Pulmonary effort is normal.     Breath sounds: Normal breath sounds.  Musculoskeletal:     Comments: The right forefoot is red, swollen, warm, and tender   Neurological:     Mental Status: She is alert.           Assessment & Plan:  Gout, treat with Mitigare 0.6 mg as needed. Recheck prn.  Alysia Penna, MD

## 2020-03-15 ENCOUNTER — Encounter: Payer: Self-pay | Admitting: Family Medicine

## 2020-03-15 NOTE — Telephone Encounter (Signed)
Please advise 

## 2020-03-16 MED ORDER — CHANTIX STARTING MONTH PAK 0.5 MG X 11 & 1 MG X 42 PO TABS: ORAL_TABLET | ORAL | 0 refills | Status: DC

## 2020-03-16 MED ORDER — VARENICLINE TARTRATE 1 MG PO TABS: 1.0000 mg | ORAL_TABLET | Freq: Two times a day (BID) | ORAL | 1 refills | Status: DC

## 2020-03-16 NOTE — Telephone Encounter (Signed)
Call in a one month Chantix starting pack, and then 2 months of continuing packs

## 2020-03-19 DIAGNOSIS — I8311 Varicose veins of right lower extremity with inflammation: Secondary | ICD-10-CM | POA: Diagnosis not present

## 2020-03-19 DIAGNOSIS — I8312 Varicose veins of left lower extremity with inflammation: Secondary | ICD-10-CM | POA: Diagnosis not present

## 2020-03-22 ENCOUNTER — Ambulatory Visit (INDEPENDENT_AMBULATORY_CARE_PROVIDER_SITE_OTHER): Payer: BC Managed Care – PPO | Admitting: Family Medicine

## 2020-03-22 ENCOUNTER — Encounter: Payer: Self-pay | Admitting: Family Medicine

## 2020-03-22 ENCOUNTER — Other Ambulatory Visit: Payer: Self-pay

## 2020-03-22 VITALS — BP 120/80 | HR 62 | Temp 97.6°F | Ht 66.5 in | Wt 185.2 lb

## 2020-03-22 DIAGNOSIS — Z0000 Encounter for general adult medical examination without abnormal findings: Secondary | ICD-10-CM | POA: Diagnosis not present

## 2020-03-22 DIAGNOSIS — E039 Hypothyroidism, unspecified: Secondary | ICD-10-CM

## 2020-03-22 MED ORDER — LEVOTHYROXINE SODIUM 100 MCG PO TABS: 100.0000 ug | ORAL_TABLET | Freq: Every day | ORAL | 3 refills | Status: DC

## 2020-03-22 NOTE — Progress Notes (Signed)
Subjective:    Patient ID: Tara Schroeder, female    DOB: 1961/02/01, 59 y.o.   MRN: [id number]  HPI Here for a well exam. She feels good except she mentions some mild tingling or numbness in the toes on each foot over the past 6 months. She only notices this in bed at night. No swelling or pain. She was here recently for a gout flare in the right foot and this responded very well to Colchicine. She has been seeing Dr. 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    Review of Systems  Constitutional: Negative.   HENT: Negative.   Eyes: Negative.   Respiratory: Negative.   Cardiovascular: Negative.   Gastrointestinal: Negative.   Genitourinary: Negative for decreased urine volume, difficulty urinating, dyspareunia, dysuria, enuresis, flank pain, frequency, hematuria, pelvic pain and urgency.  Musculoskeletal: Negative.   Skin: Negative.   Neurological: Positive for numbness.  Psychiatric/Behavioral: Negative.        Objective:   Physical Exam Constitutional:      General: She is not in acute distress.    Appearance: She is well-developed.  HENT:     Head: Normocephalic and atraumatic.     Right Ear: External ear normal.     Left Ear: External ear normal.     Nose: Nose normal.     Mouth/Throat:     Pharynx: No oropharyngeal exudate.  Eyes:     General: No scleral icterus.    Conjunctiva/sclera: Conjunctivae normal.     Pupils: Pupils are equal, round, and reactive to light.  Neck:     Thyroid: No thyromegaly.     Vascular: No JVD.  Cardiovascular:     Rate and Rhythm: Normal rate and regular rhythm.     Heart sounds: Normal heart sounds. No murmur heard.  No friction rub. No gallop.   Pulmonary:     Effort: Pulmonary effort is normal. No respiratory distress.     Breath sounds: Normal breath sounds. No wheezing or rales.  Chest:     Chest wall: No tenderness.  Abdominal:     General: Bowel sounds are normal. There is no distension.      Palpations: Abdomen is soft. There is no mass.     Tenderness: There is no abdominal tenderness. There is no guarding or rebound.  Musculoskeletal:        General: No tenderness. Normal range of motion.     Cervical back: Normal range of motion and neck supple.  Lymphadenopathy:     Cervical: No cervical adenopathy.  Skin:    General: Skin is warm and dry.     Findings: No erythema or rash.  Neurological:     Mental Status: She is alert and oriented to person, place, and time.     Cranial Nerves: No cranial nerve deficit.     Motor: No abnormal muscle tone.     Coordination: Coordination normal.     Deep Tendon Reflexes: Reflexes are normal and symmetric. Reflexes normal.  Psychiatric:        Behavior: Behavior normal.        Thought Content: Thought content normal.        Judgment: Judgment normal.           Assessment & Plan:  Well exam. We discussed diet and exercise. Get fasting labs. She may have an early neuropathy in the feet so we will check labs today for this. She does take a multivitamin  daily.  Alysia Penna, MD

## 2020-03-23 LAB — CBC WITH DIFFERENTIAL/PLATELET
Absolute Monocytes: 294 cells/uL (ref 200–950)
Basophils Absolute: 32 cells/uL (ref 0–200)
Basophils Relative: 0.7 %
Eosinophils Absolute: 161 cells/uL (ref 15–500)
Eosinophils Relative: 3.5 %
HCT: 42.8 % (ref 35.0–45.0)
Hemoglobin: 14.5 g/dL (ref 11.7–15.5)
Lymphs Abs: 1495 cells/uL (ref 850–3900)
MCH: 31.3 pg (ref 27.0–33.0)
MCHC: 33.9 g/dL (ref 32.0–36.0)
MCV: 92.4 fL (ref 80.0–100.0)
MPV: 11.7 fL (ref 7.5–12.5)
Monocytes Relative: 6.4 %
Neutro Abs: 2617 cells/uL (ref 1500–7800)
Neutrophils Relative %: 56.9 %
Platelets: 212 10*3/uL (ref 140–400)
RBC: 4.63 10*6/uL (ref 3.80–5.10)
RDW: 13.4 % (ref 11.0–15.0)
Total Lymphocyte: 32.5 %
WBC: 4.6 10*3/uL (ref 3.8–10.8)

## 2020-03-23 LAB — BASIC METABOLIC PANEL
BUN: 16 mg/dL (ref 7–25)
CO2: 26 mmol/L (ref 20–32)
Calcium: 9.4 mg/dL (ref 8.6–10.4)
Chloride: 106 mmol/L (ref 98–110)
Creat: 0.64 mg/dL (ref 0.50–1.05)
Glucose, Bld: 90 mg/dL (ref 65–99)
Potassium: 4.4 mmol/L (ref 3.5–5.3)
Sodium: 140 mmol/L (ref 135–146)

## 2020-03-23 LAB — HEPATIC FUNCTION PANEL
AG Ratio: 2 (calc) (ref 1.0–2.5)
ALT: 24 U/L (ref 6–29)
AST: 20 U/L (ref 10–35)
Albumin: 4.4 g/dL (ref 3.6–5.1)
Alkaline phosphatase (APISO): 80 U/L (ref 37–153)
Bilirubin, Direct: 0.1 mg/dL (ref 0.0–0.2)
Globulin: 2.2 g/dL (calc) (ref 1.9–3.7)
Indirect Bilirubin: 0.6 mg/dL (calc) (ref 0.2–1.2)
Total Bilirubin: 0.7 mg/dL (ref 0.2–1.2)
Total Protein: 6.6 g/dL (ref 6.1–8.1)

## 2020-03-23 LAB — LIPID PANEL
Cholesterol: 187 mg/dL (ref <200)
HDL: 50 mg/dL (ref >=50)
LDL Cholesterol (Calc): 110 mg/dL (calc) — ABNORMAL HIGH
Non-HDL Cholesterol (Calc): 137 mg/dL (calc) — ABNORMAL HIGH (ref <130)
Total CHOL/HDL Ratio: 3.7 (calc) (ref <5.0)
Triglycerides: 153 mg/dL — ABNORMAL HIGH (ref <150)

## 2020-03-23 LAB — T3, FREE: T3, Free: 3.3 pg/mL (ref 2.3–4.2)

## 2020-03-23 LAB — TSH: TSH: 0.63 mIU/L (ref 0.40–4.50)

## 2020-03-23 LAB — VITAMIN B12: Vitamin B-12: 427 pg/mL (ref 200–1100)

## 2020-03-23 LAB — T4, FREE: Free T4: 1.1 ng/dL (ref 0.8–1.8)

## 2020-03-26 ENCOUNTER — Other Ambulatory Visit: Payer: Self-pay | Admitting: Family Medicine

## 2020-04-16 DIAGNOSIS — L570 Actinic keratosis: Secondary | ICD-10-CM | POA: Diagnosis not present

## 2020-04-16 DIAGNOSIS — L818 Other specified disorders of pigmentation: Secondary | ICD-10-CM | POA: Diagnosis not present

## 2020-04-16 DIAGNOSIS — L821 Other seborrheic keratosis: Secondary | ICD-10-CM | POA: Diagnosis not present

## 2020-04-16 DIAGNOSIS — D485 Neoplasm of uncertain behavior of skin: Secondary | ICD-10-CM | POA: Diagnosis not present

## 2020-04-16 DIAGNOSIS — L814 Other melanin hyperpigmentation: Secondary | ICD-10-CM | POA: Diagnosis not present

## 2020-04-20 DIAGNOSIS — I8311 Varicose veins of right lower extremity with inflammation: Secondary | ICD-10-CM | POA: Diagnosis not present

## 2020-04-22 DIAGNOSIS — I8311 Varicose veins of right lower extremity with inflammation: Secondary | ICD-10-CM | POA: Diagnosis not present

## 2020-04-28 DIAGNOSIS — I8312 Varicose veins of left lower extremity with inflammation: Secondary | ICD-10-CM | POA: Diagnosis not present

## 2020-04-30 DIAGNOSIS — I8311 Varicose veins of right lower extremity with inflammation: Secondary | ICD-10-CM | POA: Diagnosis not present

## 2020-04-30 DIAGNOSIS — I8312 Varicose veins of left lower extremity with inflammation: Secondary | ICD-10-CM | POA: Diagnosis not present

## 2020-05-10 DIAGNOSIS — M79641 Pain in right hand: Secondary | ICD-10-CM | POA: Diagnosis not present

## 2020-05-19 DIAGNOSIS — M7981 Nontraumatic hematoma of soft tissue: Secondary | ICD-10-CM | POA: Diagnosis not present

## 2020-05-19 DIAGNOSIS — I83812 Varicose veins of left lower extremities with pain: Secondary | ICD-10-CM | POA: Diagnosis not present

## 2020-05-19 DIAGNOSIS — I8312 Varicose veins of left lower extremity with inflammation: Secondary | ICD-10-CM | POA: Diagnosis not present

## 2020-06-02 DIAGNOSIS — M7981 Nontraumatic hematoma of soft tissue: Secondary | ICD-10-CM | POA: Diagnosis not present

## 2020-06-02 DIAGNOSIS — I8311 Varicose veins of right lower extremity with inflammation: Secondary | ICD-10-CM | POA: Diagnosis not present

## 2020-06-16 DIAGNOSIS — I8311 Varicose veins of right lower extremity with inflammation: Secondary | ICD-10-CM | POA: Diagnosis not present

## 2020-06-16 DIAGNOSIS — I83811 Varicose veins of right lower extremities with pain: Secondary | ICD-10-CM | POA: Diagnosis not present

## 2020-06-16 DIAGNOSIS — M7981 Nontraumatic hematoma of soft tissue: Secondary | ICD-10-CM | POA: Diagnosis not present

## 2020-06-30 DIAGNOSIS — M7981 Nontraumatic hematoma of soft tissue: Secondary | ICD-10-CM | POA: Diagnosis not present

## 2020-06-30 DIAGNOSIS — I8311 Varicose veins of right lower extremity with inflammation: Secondary | ICD-10-CM | POA: Diagnosis not present

## 2020-07-09 DIAGNOSIS — M79644 Pain in right finger(s): Secondary | ICD-10-CM | POA: Diagnosis not present

## 2020-07-14 DIAGNOSIS — I8312 Varicose veins of left lower extremity with inflammation: Secondary | ICD-10-CM | POA: Diagnosis not present

## 2020-07-14 DIAGNOSIS — M7981 Nontraumatic hematoma of soft tissue: Secondary | ICD-10-CM | POA: Diagnosis not present

## 2020-07-15 DIAGNOSIS — M65311 Trigger thumb, right thumb: Secondary | ICD-10-CM | POA: Diagnosis not present

## 2020-07-20 ENCOUNTER — Telehealth: Payer: BC Managed Care – PPO | Admitting: Family Medicine

## 2020-10-05 DIAGNOSIS — M65311 Trigger thumb, right thumb: Secondary | ICD-10-CM | POA: Diagnosis not present

## 2020-11-30 ENCOUNTER — Telehealth (INDEPENDENT_AMBULATORY_CARE_PROVIDER_SITE_OTHER): Payer: BC Managed Care – PPO | Admitting: Family Medicine

## 2020-11-30 ENCOUNTER — Encounter: Payer: Self-pay | Admitting: Family Medicine

## 2020-11-30 DIAGNOSIS — F17210 Nicotine dependence, cigarettes, uncomplicated: Secondary | ICD-10-CM | POA: Diagnosis not present

## 2020-11-30 NOTE — Progress Notes (Signed)
   Subjective:    Patient ID: Tara Schroeder, female    DOB: 07/28/1961, 60 y.o.   MRN: [id number]  HPI Virtual Visit via Telephone Note  I connected with the patient on 11/30/20 at  2:00 PM EDT by telephone and verified that I am speaking with the correct person using two identifiers.   I discussed the limitations, risks, security and privacy concerns of performing an evaluation and management service by telephone and the availability of in person appointments. I also discussed with the patient that there may be a patient responsible charge related to this service. The patient expressed understanding and agreed to proceed.  Location patient: home Location provider: work or home office Participants present for the call: patient, provider Patient did not have a visit in the prior 7 days to address this/these issue(s).   History of Present Illness: Here asking for advice on quitting smoking. She had used Chantix successfully last year, but she slipped back into smoking again. Now she wants to quit again, but she has been told that Chantix has been recalled and that it is no longer available. She asks about alternatives. She averages 5-6 cigarettes a day.    Observations/Objective: Patient sounds cheerful and well on the phone. I do not appreciate any SOB. Speech and thought processing are grossly intact. Patient reported vitals:  Assessment and Plan: Nicotine dependence. We discussed the possibility of trying Wellbutrin, but she was afraid of side effects. Then we agreed that she will try OTC nicotine patches. Recheck as needed.  Alysia Penna, MD   Follow Up Instructions:     [phone] 11-20 [phone] 21-30 I did not refer this patient for an OV in the next 24 hours for this/these issue(s).  I discussed the assessment and treatment plan with the patient. The patient was provided an opportunity to ask questions and all were answered. The patient agreed with the plan and  demonstrated an understanding of the instructions.   The patient was advised to call back or seek an in-person evaluation if the symptoms worsen or if the condition fails to improve as anticipated.  I provided 11 minutes of non-face-to-face time during this encounter.   Alysia Penna, MD   Review of Systems     Objective:   Physical Exam        Assessment & Plan:

## 2020-12-18 ENCOUNTER — Encounter: Payer: Self-pay | Admitting: Family Medicine

## 2020-12-20 ENCOUNTER — Other Ambulatory Visit: Payer: Self-pay

## 2020-12-20 MED ORDER — COLCHICINE 0.6 MG PO CAPS: 1.0000 | ORAL_CAPSULE | ORAL | 2 refills | Status: DC | PRN

## 2021-03-17 ENCOUNTER — Other Ambulatory Visit: Payer: Self-pay | Admitting: Family Medicine

## 2021-03-21 ENCOUNTER — Other Ambulatory Visit: Payer: Self-pay | Admitting: Family Medicine

## 2021-04-06 ENCOUNTER — Other Ambulatory Visit: Payer: Self-pay | Admitting: Family Medicine

## 2021-04-06 ENCOUNTER — Encounter: Payer: Self-pay | Admitting: Family Medicine

## 2021-04-07 MED ORDER — FLUOCINONIDE EMULSIFIED BASE 0.05 % EX CREA: TOPICAL_CREAM | CUTANEOUS | 2 refills | Status: AC

## 2021-04-07 NOTE — Telephone Encounter (Signed)
Pt LOV was on 11/30/2020 Last refill was done on 03/16/2020 Please advise if ok to send refill

## 2021-04-07 NOTE — Telephone Encounter (Signed)
I sent in the rx

## 2021-04-19 ENCOUNTER — Other Ambulatory Visit: Payer: Self-pay

## 2021-04-20 ENCOUNTER — Ambulatory Visit (INDEPENDENT_AMBULATORY_CARE_PROVIDER_SITE_OTHER): Payer: BC Managed Care – PPO | Admitting: Family Medicine

## 2021-04-20 ENCOUNTER — Encounter: Payer: Self-pay | Admitting: Family Medicine

## 2021-04-20 VITALS — BP 118/80 | HR 66 | Temp 98.4°F | Ht 66.5 in | Wt 189.1 lb

## 2021-04-20 DIAGNOSIS — Z0000 Encounter for general adult medical examination without abnormal findings: Secondary | ICD-10-CM | POA: Diagnosis not present

## 2021-04-20 DIAGNOSIS — Q210 Ventricular septal defect: Secondary | ICD-10-CM

## 2021-04-20 LAB — HEMOGLOBIN A1C: Hgb A1c MFr Bld: 5.4 % (ref 4.6–6.5)

## 2021-04-20 MED ORDER — DIAZEPAM 5 MG PO TABS: 5.0000 mg | ORAL_TABLET | Freq: Four times a day (QID) | ORAL | 1 refills | Status: DC | PRN

## 2021-04-20 MED ORDER — COLCHICINE 0.6 MG PO CAPS
1.0000 | ORAL_CAPSULE | ORAL | 5 refills | Status: DC | PRN
Start: 2021-04-20 — End: 2024-02-14

## 2021-04-20 NOTE — Progress Notes (Signed)
Subjective:    Patient ID: Tara Schroeder, female    DOB: 1961/04/07, 60 y.o.   MRN: [id number]  HPI Here for a well exam. She feels well in general. Her migraines are well controlled. Her gout is well controlled as long as she watches her diet closely. She will be flying to [place] in December for her son's wedding, and she asks for something to help her relax on the plane. Finally she had varicose vein surgery last fall, and when they injected the sclerosing agent into her leg vein she quickly developed a migraine headache. The doctor then recommended that she have a "bubble test" on her heart. She asks me about this.    Review of Systems  Constitutional: Negative.   HENT: Negative.    Eyes: Negative.   Respiratory: Negative.    Cardiovascular: Negative.   Gastrointestinal: Negative.   Genitourinary:  Negative for decreased urine volume, difficulty urinating, dyspareunia, dysuria, enuresis, flank pain, frequency, hematuria, pelvic pain and urgency.  Musculoskeletal: Negative.   Skin: Negative.   Neurological: Negative.  Negative for headaches.  Psychiatric/Behavioral: Negative.        Objective:   Physical Exam Constitutional:      General: She is not in acute distress.    Appearance: Normal appearance. She is well-developed.  HENT:     Head: Normocephalic and atraumatic.     Right Ear: External ear normal.     Left Ear: External ear normal.     Nose: Nose normal.     Mouth/Throat:     Pharynx: No oropharyngeal exudate.  Eyes:     General: No scleral icterus.    Conjunctiva/sclera: Conjunctivae normal.     Pupils: Pupils are equal, round, and reactive to light.  Neck:     Thyroid: No thyromegaly.     Vascular: No JVD.  Cardiovascular:     Rate and Rhythm: Normal rate and regular rhythm.     Pulses: Normal pulses.     Heart sounds: Normal heart sounds. No murmur heard.   No friction rub. No gallop.  Pulmonary:     Effort: Pulmonary effort is normal. No  respiratory distress.     Breath sounds: Normal breath sounds. No wheezing or rales.  Chest:     Chest wall: No tenderness.  Abdominal:     General: Bowel sounds are normal. There is no distension.     Palpations: Abdomen is soft. There is no mass.     Tenderness: There is no abdominal tenderness. There is no guarding or rebound.  Musculoskeletal:        General: No tenderness. Normal range of motion.     Cervical back: Normal range of motion and neck supple.  Lymphadenopathy:     Cervical: No cervical adenopathy.  Skin:    General: Skin is warm and dry.     Findings: No erythema or rash.  Neurological:     Mental Status: She is alert and oriented to person, place, and time.     Cranial Nerves: No cranial nerve deficit.     Motor: No abnormal muscle tone.     Coordination: Coordination normal.     Deep Tendon Reflexes: Reflexes are normal and symmetric. Reflexes normal.  Psychiatric:        Behavior: Behavior normal.        Thought Content: Thought content normal.        Judgment: Judgment normal.          Assessment &  Plan:  Well exam. We discussed diet and exercise. Get fasting labs. We will set up an ECHO using perflutrin to rule out any right to left shunts in her heart. She will try Valium as needed for her upcoming plane trip. Alysia Penna, MD

## 2021-04-20 NOTE — Addendum Note (Signed)
Addended by: Amanda Cockayne on: 04/20/2021 01:49 PM   Modules accepted: Orders

## 2021-04-21 LAB — BASIC METABOLIC PANEL WITH GFR
BUN: 14 mg/dL (ref 6–23)
CO2: 22 meq/L (ref 19–32)
Calcium: 9.5 mg/dL (ref 8.4–10.5)
Chloride: 104 meq/L (ref 96–112)
Creatinine, Ser: 0.71 mg/dL (ref 0.40–1.20)
GFR: 92.37 mL/min
Glucose, Bld: 80 mg/dL (ref 70–99)
Potassium: 4 meq/L (ref 3.5–5.1)
Sodium: 139 meq/L (ref 135–145)

## 2021-04-21 LAB — CBC WITH DIFFERENTIAL/PLATELET
Basophils Absolute: 0.1 10*3/uL (ref 0.0–0.1)
Basophils Relative: 0.9 % (ref 0.0–3.0)
Eosinophils Absolute: 0.1 10*3/uL (ref 0.0–0.7)
Eosinophils Relative: 1.9 % (ref 0.0–5.0)
HCT: 42.6 % (ref 36.0–46.0)
Hemoglobin: 14.2 g/dL (ref 12.0–15.0)
Lymphocytes Relative: 30.3 % (ref 12.0–46.0)
Lymphs Abs: 1.8 10*3/uL (ref 0.7–4.0)
MCHC: 33.2 g/dL (ref 30.0–36.0)
MCV: 93 fl (ref 78.0–100.0)
Monocytes Absolute: 0.3 10*3/uL (ref 0.1–1.0)
Monocytes Relative: 5.8 % (ref 3.0–12.0)
Neutro Abs: 3.6 10*3/uL (ref 1.4–7.7)
Neutrophils Relative %: 61.1 % (ref 43.0–77.0)
Platelets: 216 10*3/uL (ref 150.0–400.0)
RBC: 4.57 Mil/uL (ref 3.87–5.11)
RDW: 13.9 % (ref 11.5–15.5)
WBC: 5.9 10*3/uL (ref 4.0–10.5)

## 2021-04-21 LAB — HEPATIC FUNCTION PANEL
ALT: 26 U/L (ref 0–35)
AST: 20 U/L (ref 0–37)
Albumin: 4.5 g/dL (ref 3.5–5.2)
Alkaline Phosphatase: 79 U/L (ref 39–117)
Bilirubin, Direct: 0.1 mg/dL (ref 0.0–0.3)
Total Bilirubin: 0.5 mg/dL (ref 0.2–1.2)
Total Protein: 7.2 g/dL (ref 6.0–8.3)

## 2021-04-21 LAB — LIPID PANEL
Cholesterol: 179 mg/dL (ref 0–200)
HDL: 51.7 mg/dL
LDL Cholesterol: 102 mg/dL — ABNORMAL HIGH (ref 0–99)
NonHDL: 127.28
Total CHOL/HDL Ratio: 3
Triglycerides: 128 mg/dL (ref 0.0–149.0)
VLDL: 25.6 mg/dL (ref 0.0–40.0)

## 2021-04-21 LAB — T4, FREE: Free T4: 0.81 ng/dL (ref 0.60–1.60)

## 2021-04-21 LAB — T3, FREE: T3, Free: 3.1 pg/mL (ref 2.3–4.2)

## 2021-04-21 LAB — TSH: TSH: 0.41 u[IU]/mL (ref 0.35–5.50)

## 2021-05-17 ENCOUNTER — Encounter (HOSPITAL_COMMUNITY): Payer: Self-pay

## 2021-05-17 ENCOUNTER — Encounter (HOSPITAL_COMMUNITY): Payer: Self-pay | Admitting: Radiology

## 2021-05-17 ENCOUNTER — Ambulatory Visit (HOSPITAL_COMMUNITY): Payer: BC Managed Care – PPO

## 2021-05-17 ENCOUNTER — Other Ambulatory Visit: Payer: Self-pay

## 2021-05-17 NOTE — Progress Notes (Signed)
Patient ID: Tara Schroeder, female   DOB: Nov 20, 1960, 60 y.o.   MRN: [id number] Patient's echocardiogram was cancelled and rescheduled at a later date to verify order with ordering physician. 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 Due to clinical notes, wanted to verify if this should be a echocardiogram with bubble study for ASD. If so, per protocol, this must be ordered by a physician. Patient is aware and voice understanding. Patient has been rescheduled for November 3. I will contact Dr. Sarajane Jews to verify order.

## 2021-05-23 ENCOUNTER — Other Ambulatory Visit: Payer: Self-pay | Admitting: Family Medicine

## 2021-05-25 NOTE — Telephone Encounter (Signed)
Pt LOV was 04/20/2021 Last refill done on 04/17/2021 Please advise

## 2021-06-02 ENCOUNTER — Ambulatory Visit (HOSPITAL_COMMUNITY): Payer: BC Managed Care – PPO

## 2021-06-09 ENCOUNTER — Other Ambulatory Visit (HOSPITAL_COMMUNITY): Payer: BC Managed Care – PPO

## 2021-06-14 ENCOUNTER — Ambulatory Visit (HOSPITAL_COMMUNITY): Payer: BC Managed Care – PPO

## 2021-06-15 ENCOUNTER — Other Ambulatory Visit: Payer: Self-pay | Admitting: Family Medicine

## 2021-06-20 ENCOUNTER — Other Ambulatory Visit: Payer: Self-pay | Admitting: Family Medicine

## 2021-08-23 ENCOUNTER — Telehealth (HOSPITAL_COMMUNITY): Payer: Self-pay | Admitting: Family Medicine

## 2021-08-23 NOTE — Telephone Encounter (Signed)
Patient has called and cancelled echocardiogram that was ordered and has cancelled :  08/23/2021 9:09 AM LV:DIXVE, PHILLIP  Cancel Rsn: Patient (sick patient has cancelled on 05/17/21,06/02/21,05/31/21 and 06/14/21 and 0126/23.   Order will be removed from the echo WQ and if patient calls back to reschedule we will reinstate the order.

## 2021-08-25 ENCOUNTER — Other Ambulatory Visit (HOSPITAL_COMMUNITY): Payer: BC Managed Care – PPO

## 2021-09-13 ENCOUNTER — Other Ambulatory Visit: Payer: Self-pay

## 2021-09-13 ENCOUNTER — Ambulatory Visit (INDEPENDENT_AMBULATORY_CARE_PROVIDER_SITE_OTHER)
Admission: RE | Admit: 2021-09-13 | Discharge: 2021-09-13 | Disposition: A | Discharge status: Home / Self Care | Payer: BC Managed Care – PPO | Source: Ambulatory Visit | Attending: Family Medicine | Admitting: Family Medicine

## 2021-09-13 ENCOUNTER — Encounter: Payer: Self-pay | Admitting: Family Medicine

## 2021-09-13 ENCOUNTER — Ambulatory Visit (INDEPENDENT_AMBULATORY_CARE_PROVIDER_SITE_OTHER): Payer: BC Managed Care – PPO | Admitting: Family Medicine

## 2021-09-13 VITALS — BP 126/88 | HR 75 | Temp 98.0°F | Wt 192.2 lb

## 2021-09-13 DIAGNOSIS — J189 Pneumonia, unspecified organism: Secondary | ICD-10-CM | POA: Diagnosis not present

## 2021-09-13 DIAGNOSIS — R059 Cough, unspecified: Secondary | ICD-10-CM | POA: Diagnosis not present

## 2021-09-13 DIAGNOSIS — R0989 Other specified symptoms and signs involving the circulatory and respiratory systems: Secondary | ICD-10-CM | POA: Diagnosis not present

## 2021-09-13 MED ORDER — LEVOFLOXACIN 500 MG PO TABS: 500.0000 mg | ORAL_TABLET | Freq: Every day | ORAL | 0 refills | Status: AC

## 2021-09-13 MED ORDER — CEFTRIAXONE SODIUM 1 G IJ SOLR
1.0000 g | Freq: Once | INTRAMUSCULAR | Status: AC
  Administered 2021-09-13: 1 g via INTRAMUSCULAR

## 2021-09-13 NOTE — Progress Notes (Signed)
° °  Subjective:    Patient ID: Tara Schroeder, female    DOB: 28-Apr-1961, 61 y.o.   MRN: [id number]  HPI Here for 4 weeks of chest congestion, coughing, wheezing and SOB. Sometimes the cough produces green sputum, and she has seen flecks of bright red blood in the sputum. No fever or chest pain. She saw an urgent care in [place] a week ago, and they gave her Amoxicillin. This has not helped at all. She is using Nyquil and Dayquil.    Review of Systems  Constitutional:  Positive for fatigue. Negative for chills, diaphoresis and fever.  HENT:  Positive for postnasal drip and sore throat. Negative for sinus pressure.   Eyes: Negative.   Respiratory:  Positive for cough, chest tightness, shortness of breath and wheezing.   Cardiovascular: Negative.   Gastrointestinal: Negative.       Objective:   Physical Exam Constitutional:      Comments: Coughing frequently   HENT:     Right Ear: Tympanic membrane, ear canal and external ear normal.     Left Ear: Tympanic membrane, ear canal and external ear normal.     Nose: Nose normal.     Mouth/Throat:     Pharynx: Oropharynx is clear.  Eyes:     Conjunctiva/sclera: Conjunctivae normal.  Cardiovascular:     Rate and Rhythm: Normal rate and regular rhythm.     Pulses: Normal pulses.     Heart sounds: Normal heart sounds.  Pulmonary:     Effort: Pulmonary effort is normal. No respiratory distress.     Breath sounds: No wheezing.     Comments: There are rales with consolidation in the right posterior base  Lymphadenopathy:     Cervical: No cervical adenopathy.  Neurological:     Mental Status: She is alert.          Assessment & Plan:  Community acquired RLL pneumonia. She is given a shot of Rocephin. She will stop the Amoxicillin and begin 10 days of Levaquin. Get a CXR today. We will recheck her later this week.  Alysia Penna, MD

## 2021-09-13 NOTE — Addendum Note (Signed)
Addended by: Wyvonne Lenz on: 09/13/2021 11:47 AM   Modules accepted: Orders

## 2021-09-16 ENCOUNTER — Ambulatory Visit: Payer: BC Managed Care – PPO | Admitting: Family Medicine

## 2021-09-16 ENCOUNTER — Encounter: Payer: Self-pay | Admitting: Family Medicine

## 2021-09-16 VITALS — BP 118/80 | HR 76 | Temp 97.7°F | Wt 192.0 lb

## 2021-09-16 DIAGNOSIS — J189 Pneumonia, unspecified organism: Secondary | ICD-10-CM | POA: Diagnosis not present

## 2021-09-16 NOTE — Progress Notes (Signed)
° °  Subjective:    Patient ID: Tara Schroeder, female    DOB: 10-03-1960, 61 y.o.   MRN: [id number]  HPI Here to follow up on a RLL pneumonia that we diagnosed on 09-13-21. That day she was given a shot of Rocephin and she started on a 10 day course of Levaquin. Now after 4 doses she feels a little better with less chest congestion and less coughing. She has more energy and less SOB. She still has a stuffy head with PND. She is drinking fluids and taking Robitussin. Interestingly her CXR at the last visit was clear.    Review of Systems  Constitutional: Negative.   HENT:  Positive for postnasal drip. Negative for sinus pain.   Eyes: Negative.   Respiratory:  Positive for cough. Negative for chest tightness, shortness of breath and wheezing.   Cardiovascular: Negative.       Objective:   Physical Exam Constitutional:      Appearance: Normal appearance. She is not ill-appearing.  Cardiovascular:     Rate and Rhythm: Normal rate and regular rhythm.     Pulses: Normal pulses.     Heart sounds: Normal heart sounds.    No gallop.  Pulmonary:     Effort: Pulmonary effort is normal. No respiratory distress.     Breath sounds: No wheezing.     Comments: There are still quiet rales in the right base  Lymphadenopathy:     Cervical: No cervical adenopathy.  Neurological:     Mental Status: She is alert.          Assessment & Plan:  RLL pneumonia partially treated. She will finish out the Levaquin and recheck as needed.  Alysia Penna, MD

## 2021-12-19 ENCOUNTER — Other Ambulatory Visit: Payer: Self-pay | Admitting: Family Medicine

## 2022-01-27 ENCOUNTER — Encounter: Payer: Self-pay | Admitting: Family Medicine

## 2022-02-02 ENCOUNTER — Encounter: Payer: Self-pay | Admitting: Family Medicine

## 2022-02-02 ENCOUNTER — Ambulatory Visit: Payer: BC Managed Care – PPO | Admitting: Family Medicine

## 2022-02-02 VITALS — BP 120/80 | HR 61 | Temp 97.7°F | Wt 193.0 lb

## 2022-02-02 DIAGNOSIS — M109 Gout, unspecified: Secondary | ICD-10-CM

## 2022-02-02 DIAGNOSIS — L409 Psoriasis, unspecified: Secondary | ICD-10-CM

## 2022-02-02 DIAGNOSIS — J3089 Other allergic rhinitis: Secondary | ICD-10-CM

## 2022-02-02 LAB — URIC ACID: Uric Acid, Serum: 8.2 mg/dL — ABNORMAL HIGH (ref 2.4–7.0)

## 2022-02-02 MED ORDER — TRIAMCINOLONE ACETONIDE 0.1 % EX CREA: 1.0000 | TOPICAL_CREAM | Freq: Two times a day (BID) | CUTANEOUS | 2 refills | Status: DC

## 2022-02-02 MED ORDER — ALLOPURINOL 100 MG PO TABS: 100.0000 mg | ORAL_TABLET | Freq: Every day | ORAL | 3 refills | Status: DC

## 2022-02-02 NOTE — Progress Notes (Signed)
   Subjective:    Patient ID: Tara Schroeder, female    DOB: 1961-03-21, 61 y.o.   MRN: [id number]  HPI Here for 3 issues. First she has had a mild dry cough for the past week. No SOB or fever. She thinks she is getting over it but wants [place] to check it because we treated her for a RLL pneumonia in February. Second is having gout attacks more frequently (about once a month) and she asks if this can be prevented. I don't think she has ever had a uric acid level checked. The gout usually affects one or both toes, and it responds quickly to taking Colchicine. Third for the past year she has had itchy patches of red skin come and go on her elbows. She applies OTC cortisone creams and this seems to help them fade away. No other part of her body is affected.    Review of Systems  Constitutional: Negative.   Eyes: Negative.   Respiratory:  Positive for cough. Negative for shortness of breath and wheezing.   Cardiovascular: Negative.   Musculoskeletal:  Positive for arthralgias.  Skin:  Positive for rash.       Objective:   Physical Exam Constitutional:      Appearance: Normal appearance. She is not ill-appearing.  HENT:     Right Ear: Tympanic membrane, ear canal and external ear normal.     Left Ear: Tympanic membrane, ear canal and external ear normal.     Nose: Nose normal.     Mouth/Throat:     Pharynx: Oropharynx is clear.  Cardiovascular:     Rate and Rhythm: Normal rate and regular rhythm.     Pulses: Normal pulses.     Heart sounds: Normal heart sounds.  Pulmonary:     Effort: Pulmonary effort is normal.     Breath sounds: Normal breath sounds.  Musculoskeletal:     Comments: The right great toe is tender at the MTP joint. No swelling or erythema or warmth  Lymphadenopathy:     Cervical: No cervical adenopathy.  Skin:    Comments: The extensor surfaces of both elbows have small patches of raised red skin   Neurological:     Mental Status: She is alert.            Assessment & Plan:  Her cough seems to be coming from allergies. She can use Claritin and Delsym as needed. For the gout, we will check a uric acid level today and we will start her on Allopurinol 100 mg daily for prophylaxis. The rash on her elbows is psoriasis, and we will treat this with Triamcinolone cream prn. Alysia Penna, MD

## 2022-02-03 ENCOUNTER — Other Ambulatory Visit: Payer: Self-pay

## 2022-02-03 DIAGNOSIS — M109 Gout, unspecified: Secondary | ICD-10-CM

## 2022-03-17 ENCOUNTER — Other Ambulatory Visit: Payer: Self-pay | Admitting: Family Medicine

## 2022-06-12 ENCOUNTER — Other Ambulatory Visit: Payer: Self-pay | Admitting: Family Medicine

## 2022-06-14 DIAGNOSIS — Z1231 Encounter for screening mammogram for malignant neoplasm of breast: Secondary | ICD-10-CM | POA: Diagnosis not present

## 2022-06-14 DIAGNOSIS — Z01419 Encounter for gynecological examination (general) (routine) without abnormal findings: Secondary | ICD-10-CM | POA: Diagnosis not present

## 2022-06-14 DIAGNOSIS — Z124 Encounter for screening for malignant neoplasm of cervix: Secondary | ICD-10-CM | POA: Diagnosis not present

## 2022-06-14 DIAGNOSIS — Z1151 Encounter for screening for human papillomavirus (HPV): Secondary | ICD-10-CM | POA: Diagnosis not present

## 2022-06-14 DIAGNOSIS — Z6830 Body mass index (BMI) 30.0-30.9, adult: Secondary | ICD-10-CM | POA: Diagnosis not present

## 2022-06-21 DIAGNOSIS — R350 Frequency of micturition: Secondary | ICD-10-CM | POA: Diagnosis not present

## 2022-06-21 DIAGNOSIS — N3001 Acute cystitis with hematuria: Secondary | ICD-10-CM | POA: Diagnosis not present

## 2022-07-18 DIAGNOSIS — M25511 Pain in right shoulder: Secondary | ICD-10-CM | POA: Diagnosis not present

## 2022-07-18 DIAGNOSIS — M25532 Pain in left wrist: Secondary | ICD-10-CM | POA: Diagnosis not present

## 2022-09-11 ENCOUNTER — Other Ambulatory Visit: Payer: Self-pay | Admitting: Family Medicine

## 2022-09-21 ENCOUNTER — Encounter: Payer: BC Managed Care – PPO | Admitting: Family Medicine

## 2022-09-21 DIAGNOSIS — M25532 Pain in left wrist: Secondary | ICD-10-CM | POA: Diagnosis not present

## 2022-09-21 DIAGNOSIS — M25511 Pain in right shoulder: Secondary | ICD-10-CM | POA: Diagnosis not present

## 2022-10-05 DIAGNOSIS — M1812 Unilateral primary osteoarthritis of first carpometacarpal joint, left hand: Secondary | ICD-10-CM | POA: Diagnosis not present

## 2022-10-05 DIAGNOSIS — M25511 Pain in right shoulder: Secondary | ICD-10-CM | POA: Diagnosis not present

## 2022-10-10 DIAGNOSIS — M25511 Pain in right shoulder: Secondary | ICD-10-CM | POA: Diagnosis not present

## 2022-10-12 ENCOUNTER — Ambulatory Visit (INDEPENDENT_AMBULATORY_CARE_PROVIDER_SITE_OTHER): Payer: BC Managed Care – PPO | Admitting: Family Medicine

## 2022-10-12 ENCOUNTER — Encounter: Payer: Self-pay | Admitting: Family Medicine

## 2022-10-12 VITALS — BP 122/80 | HR 70 | Temp 97.6°F | Ht 66.5 in | Wt 194.0 lb

## 2022-10-12 DIAGNOSIS — E039 Hypothyroidism, unspecified: Secondary | ICD-10-CM | POA: Diagnosis not present

## 2022-10-12 DIAGNOSIS — Z0000 Encounter for general adult medical examination without abnormal findings: Secondary | ICD-10-CM

## 2022-10-12 LAB — LIPID PANEL
Cholesterol: 192 mg/dL (ref 0–200)
HDL: 55.3 mg/dL (ref >39.00)
LDL Cholesterol: 107 mg/dL — ABNORMAL HIGH (ref 0–99)
NonHDL: 137.02
Total CHOL/HDL Ratio: 3
Triglycerides: 150 mg/dL — ABNORMAL HIGH (ref 0.0–149.0)
VLDL: 30 mg/dL (ref 0.0–40.0)

## 2022-10-12 LAB — CBC WITH DIFFERENTIAL/PLATELET
Basophils Absolute: 0 10*3/uL (ref 0.0–0.1)
Basophils Relative: 0.5 % (ref 0.0–3.0)
Eosinophils Absolute: 0.3 10*3/uL (ref 0.0–0.7)
Eosinophils Relative: 3.8 % (ref 0.0–5.0)
HCT: 45.5 % (ref 36.0–46.0)
Hemoglobin: 15.4 g/dL — ABNORMAL HIGH (ref 12.0–15.0)
Lymphocytes Relative: 25 % (ref 12.0–46.0)
Lymphs Abs: 1.8 10*3/uL (ref 0.7–4.0)
MCHC: 33.8 g/dL (ref 30.0–36.0)
MCV: 93.6 fl (ref 78.0–100.0)
Monocytes Absolute: 0.5 10*3/uL (ref 0.1–1.0)
Monocytes Relative: 6.3 % (ref 3.0–12.0)
Neutro Abs: 4.6 10*3/uL (ref 1.4–7.7)
Neutrophils Relative %: 64.4 % (ref 43.0–77.0)
Platelets: 245 10*3/uL (ref 150.0–400.0)
RBC: 4.86 Mil/uL (ref 3.87–5.11)
RDW: 14.5 % (ref 11.5–15.5)
WBC: 7.2 10*3/uL (ref 4.0–10.5)

## 2022-10-12 LAB — BASIC METABOLIC PANEL
BUN: 14 mg/dL (ref 6–23)
CO2: 26 mEq/L (ref 19–32)
Calcium: 9.6 mg/dL (ref 8.4–10.5)
Chloride: 103 mEq/L (ref 96–112)
Creatinine, Ser: 0.69 mg/dL (ref 0.40–1.20)
GFR: 93.31 mL/min (ref >60.00)
Glucose, Bld: 91 mg/dL (ref 70–99)
Potassium: 4 mEq/L (ref 3.5–5.1)
Sodium: 140 mEq/L (ref 135–145)

## 2022-10-12 LAB — HEPATIC FUNCTION PANEL
ALT: 30 U/L (ref 0–35)
AST: 23 U/L (ref 0–37)
Albumin: 4.3 g/dL (ref 3.5–5.2)
Alkaline Phosphatase: 91 U/L (ref 39–117)
Bilirubin, Direct: 0.1 mg/dL (ref 0.0–0.3)
Total Bilirubin: 0.7 mg/dL (ref 0.2–1.2)
Total Protein: 7.4 g/dL (ref 6.0–8.3)

## 2022-10-12 LAB — HEMOGLOBIN A1C: Hgb A1c MFr Bld: 5.5 % (ref 4.6–6.5)

## 2022-10-12 LAB — T3, FREE: T3, Free: 3.2 pg/mL (ref 2.3–4.2)

## 2022-10-12 LAB — TSH: TSH: 1 u[IU]/mL (ref 0.35–5.50)

## 2022-10-12 LAB — T4, FREE: Free T4: 0.9 ng/dL (ref 0.60–1.60)

## 2022-10-12 MED ORDER — LEVOTHYROXINE SODIUM 100 MCG PO TABS
100.0000 ug | ORAL_TABLET | Freq: Every day | ORAL | 3 refills | Status: DC
Start: 2022-10-12 — End: 2023-10-30

## 2022-10-12 NOTE — Progress Notes (Signed)
   Subjective:    Patient ID: Tara Schroeder, female    DOB: 1960/12/26, 62 y.o.   MRN: [id number]  HPI Here for a well exam. She feels fine in general. She is seeing [facility] for left thumb pain and right shoulder pain. It sounds like she has a torn rotator cuff. She very seldom has gout flares, and when she does a single Colchicine pill take scare of it. She no longer takes Allopurinol.    Review of Systems  Constitutional: Negative.   HENT: Negative.    Eyes: Negative.   Respiratory: Negative.    Cardiovascular: Negative.   Gastrointestinal: Negative.   Genitourinary:  Negative for decreased urine volume, difficulty urinating, dyspareunia, dysuria, enuresis, flank pain, frequency, hematuria, pelvic pain and urgency.  Musculoskeletal: Negative.   Skin: Negative.   Neurological: Negative.  Negative for headaches.  Psychiatric/Behavioral: Negative.         Objective:   Physical Exam Constitutional:      General: She is not in acute distress.    Appearance: Normal appearance. She is well-developed.  HENT:     Head: Normocephalic and atraumatic.     Right Ear: External ear normal.     Left Ear: External ear normal.     Nose: Nose normal.     Mouth/Throat:     Pharynx: No oropharyngeal exudate.  Eyes:     General: No scleral icterus.    Conjunctiva/sclera: Conjunctivae normal.     Pupils: Pupils are equal, round, and reactive to light.  Neck:     Thyroid: No thyromegaly.     Vascular: No carotid bruit or JVD.  Cardiovascular:     Rate and Rhythm: Normal rate and regular rhythm.     Heart sounds: Normal heart sounds. No murmur heard.    No friction rub. No gallop.  Pulmonary:     Effort: Pulmonary effort is normal. No respiratory distress.     Breath sounds: Normal breath sounds. No wheezing or rales.  Chest:     Chest wall: No tenderness.  Abdominal:     General: Bowel sounds are normal. There is no distension.     Palpations: Abdomen is soft. There is  no mass.     Tenderness: There is no abdominal tenderness. There is no guarding or rebound.  Musculoskeletal:        General: No tenderness. Normal range of motion.     Cervical back: Normal range of motion and neck supple.  Lymphadenopathy:     Cervical: No cervical adenopathy.  Skin:    General: Skin is warm and dry.     Findings: No erythema or rash.  Neurological:     Mental Status: She is alert and oriented to person, place, and time.     Cranial Nerves: No cranial nerve deficit.     Motor: No abnormal muscle tone.     Coordination: Coordination normal.     Deep Tendon Reflexes: Reflexes are normal and symmetric. Reflexes normal.  Psychiatric:        Behavior: Behavior normal.        Thought Content: Thought content normal.        Judgment: Judgment normal.           Assessment & Plan:  Well exam. We discussed diet and exercise. Get fasting labs. Also per her request, we will set up a cardiac calcium score CT.  Alysia Penna, MD

## 2022-10-27 DIAGNOSIS — M7541 Impingement syndrome of right shoulder: Secondary | ICD-10-CM | POA: Diagnosis not present

## 2022-11-21 ENCOUNTER — Ambulatory Visit (HOSPITAL_BASED_OUTPATIENT_CLINIC_OR_DEPARTMENT_OTHER)
Admission: RE | Admit: 2022-11-21 | Discharge: 2022-11-21 | Disposition: A | Discharge status: Home / Self Care | Payer: BC Managed Care – PPO | Source: Ambulatory Visit | Attending: Family Medicine | Admitting: Family Medicine

## 2022-11-21 DIAGNOSIS — Z0000 Encounter for general adult medical examination without abnormal findings: Secondary | ICD-10-CM

## 2022-12-11 ENCOUNTER — Other Ambulatory Visit: Payer: Self-pay | Admitting: Family Medicine

## 2023-02-08 ENCOUNTER — Other Ambulatory Visit: Payer: Self-pay | Admitting: Family Medicine

## 2023-02-13 ENCOUNTER — Encounter: Payer: Self-pay | Admitting: Family Medicine

## 2023-02-14 MED ORDER — VARENICLINE TARTRATE 0.5 MG PO TABS: 0.5000 mg | ORAL_TABLET | Freq: Two times a day (BID) | ORAL | 5 refills | Status: AC

## 2023-02-14 NOTE — Telephone Encounter (Signed)
Done

## 2023-05-08 DIAGNOSIS — L821 Other seborrheic keratosis: Secondary | ICD-10-CM | POA: Diagnosis not present

## 2023-05-08 DIAGNOSIS — D2239 Melanocytic nevi of other parts of face: Secondary | ICD-10-CM | POA: Diagnosis not present

## 2023-06-04 DIAGNOSIS — H0288B Meibomian gland dysfunction left eye, upper and lower eyelids: Secondary | ICD-10-CM | POA: Diagnosis not present

## 2023-06-04 DIAGNOSIS — H0288A Meibomian gland dysfunction right eye, upper and lower eyelids: Secondary | ICD-10-CM | POA: Diagnosis not present

## 2023-06-04 DIAGNOSIS — H16223 Keratoconjunctivitis sicca, not specified as Sjogren's, bilateral: Secondary | ICD-10-CM | POA: Diagnosis not present

## 2023-07-11 DIAGNOSIS — Z01419 Encounter for gynecological examination (general) (routine) without abnormal findings: Secondary | ICD-10-CM | POA: Diagnosis not present

## 2023-07-11 DIAGNOSIS — Z1231 Encounter for screening mammogram for malignant neoplasm of breast: Secondary | ICD-10-CM | POA: Diagnosis not present

## 2023-07-11 DIAGNOSIS — Z6831 Body mass index (BMI) 31.0-31.9, adult: Secondary | ICD-10-CM | POA: Diagnosis not present

## 2023-07-19 ENCOUNTER — Encounter: Payer: Self-pay | Admitting: Family Medicine

## 2023-07-20 NOTE — Telephone Encounter (Signed)
Dr. Maris Berger is excellent. I can do a referral if needed

## 2023-09-17 ENCOUNTER — Other Ambulatory Visit: Payer: Self-pay | Admitting: Medical Genetics

## 2023-09-26 ENCOUNTER — Encounter: Payer: Self-pay | Admitting: Family Medicine

## 2023-09-26 ENCOUNTER — Ambulatory Visit: Payer: BC Managed Care – PPO | Admitting: Family Medicine

## 2023-09-26 VITALS — BP 128/82 | HR 66 | Temp 98.1°F | Wt 197.0 lb

## 2023-09-26 DIAGNOSIS — J40 Bronchitis, not specified as acute or chronic: Secondary | ICD-10-CM | POA: Diagnosis not present

## 2023-09-26 DIAGNOSIS — R059 Cough, unspecified: Secondary | ICD-10-CM

## 2023-09-26 LAB — POC COVID19 BINAXNOW: SARS Coronavirus 2 Ag: NEGATIVE

## 2023-09-26 LAB — POCT INFLUENZA A/B
Influenza A, POC: NEGATIVE
Influenza B, POC: NEGATIVE

## 2023-09-26 MED ORDER — AMOXICILLIN-POT CLAVULANATE 875-125 MG PO TABS: 1.0000 | ORAL_TABLET | Freq: Two times a day (BID) | ORAL | 0 refills | Status: DC

## 2023-09-26 NOTE — Progress Notes (Signed)
   Subjective:    Patient ID: Lucianne Lei, female    DOB: [birth year], 63 y.o.   MRN: [id number]  HPI Here for 4 weeks fo URI symptoms. This started with head congestion, ST, a dry cough, and a low grade fever. The fever and head symptoms cleared after a week, but the cough remains. She feels congested in the chest. No SOB. Using Nyquil.    Review of Systems  Constitutional: Negative.   HENT:  Positive for congestion. Negative for ear pain, postnasal drip, sinus pressure and sore throat.   Eyes: Negative.   Respiratory:  Positive for cough and chest tightness. Negative for shortness of breath and wheezing.        Objective:   Physical Exam Constitutional:      Appearance: Normal appearance. She is not ill-appearing.  HENT:     Right Ear: Tympanic membrane, ear canal and external ear normal.     Left Ear: Tympanic membrane, ear canal and external ear normal.     Nose: Nose normal.     Mouth/Throat:     Pharynx: Oropharynx is clear.  Eyes:     Conjunctiva/sclera: Conjunctivae normal.  Pulmonary:     Effort: Pulmonary effort is normal.     Breath sounds: Rhonchi present. No wheezing or rales.  Lymphadenopathy:     Cervical: No cervical adenopathy.  Neurological:     Mental Status: She is alert.           Assessment & Plan:  Bronchitis, treat with 10 days of Augmentin.  Gershon Crane, MD

## 2023-09-26 NOTE — Addendum Note (Signed)
 Addended by: Carola Rhine on: 09/26/2023 10:35 AM   Modules accepted: Orders

## 2023-09-27 ENCOUNTER — Other Ambulatory Visit: Payer: BC Managed Care – PPO

## 2023-10-09 ENCOUNTER — Other Ambulatory Visit (HOSPITAL_COMMUNITY)
Admission: RE | Admit: 2023-10-09 | Discharge: 2023-10-09 | Disposition: A | Discharge status: Home / Self Care | Payer: Self-pay | Source: Ambulatory Visit | Attending: Medical Genetics | Admitting: Medical Genetics

## 2023-10-19 LAB — GENECONNECT MOLECULAR SCREEN: Genetic Analysis Overall Interpretation: NEGATIVE

## 2023-10-23 DIAGNOSIS — M1811 Unilateral primary osteoarthritis of first carpometacarpal joint, right hand: Secondary | ICD-10-CM | POA: Diagnosis not present

## 2023-10-30 ENCOUNTER — Encounter: Payer: Self-pay | Admitting: Family Medicine

## 2023-10-30 ENCOUNTER — Ambulatory Visit (INDEPENDENT_AMBULATORY_CARE_PROVIDER_SITE_OTHER): Admitting: Family Medicine

## 2023-10-30 VITALS — BP 122/80 | HR 68 | Temp 97.8°F | Ht 66.25 in | Wt 196.2 lb

## 2023-10-30 DIAGNOSIS — Z0000 Encounter for general adult medical examination without abnormal findings: Secondary | ICD-10-CM

## 2023-10-30 DIAGNOSIS — E039 Hypothyroidism, unspecified: Secondary | ICD-10-CM

## 2023-10-30 MED ORDER — LEVOTHYROXINE SODIUM 100 MCG PO TABS: 100.0000 ug | ORAL_TABLET | Freq: Every day | ORAL | 3 refills | Status: AC

## 2023-10-30 NOTE — Progress Notes (Signed)
   Subjective:    Patient ID: Lucianne Lei, female    DOB: 04-20-1961, 63 y.o.   MRN: [id number]  HPI Here for a well exam. She feels fine. She recently saw her GYN for a Pap smear and a mammogram.    Review of Systems  Constitutional: Negative.   HENT: Negative.    Eyes: Negative.   Respiratory: Negative.    Cardiovascular: Negative.   Gastrointestinal: Negative.   Genitourinary:  Negative for decreased urine volume, difficulty urinating, dyspareunia, dysuria, enuresis, flank pain, frequency, hematuria, pelvic pain and urgency.  Musculoskeletal:  Positive for arthralgias.  Skin: Negative.   Neurological: Negative.  Negative for headaches.  Psychiatric/Behavioral: Negative.         Objective:   Physical Exam Constitutional:      General: She is not in acute distress.    Appearance: Normal appearance. She is well-developed.  HENT:     Head: Normocephalic and atraumatic.     Right Ear: External ear normal.     Left Ear: External ear normal.     Nose: Nose normal.     Mouth/Throat:     Pharynx: No oropharyngeal exudate.  Eyes:     General: No scleral icterus.    Conjunctiva/sclera: Conjunctivae normal.     Pupils: Pupils are equal, round, and reactive to light.  Neck:     Thyroid: No thyromegaly.     Vascular: No JVD.  Cardiovascular:     Rate and Rhythm: Normal rate and regular rhythm.     Pulses: Normal pulses.     Heart sounds: Normal heart sounds. No murmur heard.    No friction rub. No gallop.  Pulmonary:     Effort: Pulmonary effort is normal. No respiratory distress.     Breath sounds: Normal breath sounds. No wheezing or rales.  Chest:     Chest wall: No tenderness.  Abdominal:     General: Bowel sounds are normal. There is no distension.     Palpations: Abdomen is soft. There is no mass.     Tenderness: There is no abdominal tenderness. There is no guarding or rebound.  Musculoskeletal:        General: No tenderness. Normal range of motion.      Cervical back: Normal range of motion and neck supple.  Lymphadenopathy:     Cervical: No cervical adenopathy.  Skin:    General: Skin is warm and dry.     Findings: No erythema or rash.  Neurological:     General: No focal deficit present.     Mental Status: She is alert and oriented to person, place, and time.     Cranial Nerves: No cranial nerve deficit.     Motor: No abnormal muscle tone.     Coordination: Coordination normal.     Deep Tendon Reflexes: Reflexes are normal and symmetric. Reflexes normal.  Psychiatric:        Mood and Affect: Mood normal.        Behavior: Behavior normal.        Thought Content: Thought content normal.        Judgment: Judgment normal.           Assessment & Plan:  Well exam. We discussed diet and exercise. Get fasting labs. Gershon Crane, MD

## 2023-11-08 ENCOUNTER — Other Ambulatory Visit

## 2023-11-08 DIAGNOSIS — Z131 Encounter for screening for diabetes mellitus: Secondary | ICD-10-CM | POA: Diagnosis not present

## 2023-11-08 DIAGNOSIS — Z13220 Encounter for screening for lipoid disorders: Secondary | ICD-10-CM | POA: Diagnosis not present

## 2023-11-08 DIAGNOSIS — Z0000 Encounter for general adult medical examination without abnormal findings: Secondary | ICD-10-CM | POA: Diagnosis not present

## 2023-11-08 DIAGNOSIS — E039 Hypothyroidism, unspecified: Secondary | ICD-10-CM | POA: Diagnosis not present

## 2023-11-08 LAB — LIPID PANEL
Cholesterol: 184 mg/dL (ref 0–200)
HDL: 53.3 mg/dL (ref >39.00)
LDL Cholesterol: 101 mg/dL — ABNORMAL HIGH (ref 0–99)
NonHDL: 130.93
Total CHOL/HDL Ratio: 3
Triglycerides: 151 mg/dL — ABNORMAL HIGH (ref 0.0–149.0)
VLDL: 30.2 mg/dL (ref 0.0–40.0)

## 2023-11-08 LAB — CBC WITH DIFFERENTIAL/PLATELET
Basophils Absolute: 0 10*3/uL (ref 0.0–0.1)
Basophils Relative: 0.5 % (ref 0.0–3.0)
Eosinophils Absolute: 0.2 10*3/uL (ref 0.0–0.7)
Eosinophils Relative: 2.6 % (ref 0.0–5.0)
HCT: 43.5 % (ref 36.0–46.0)
Hemoglobin: 14.6 g/dL (ref 12.0–15.0)
Lymphocytes Relative: 22.5 % (ref 12.0–46.0)
Lymphs Abs: 1.4 10*3/uL (ref 0.7–4.0)
MCHC: 33.6 g/dL (ref 30.0–36.0)
MCV: 93.6 fl (ref 78.0–100.0)
Monocytes Absolute: 0.3 10*3/uL (ref 0.1–1.0)
Monocytes Relative: 5.5 % (ref 3.0–12.0)
Neutro Abs: 4.2 10*3/uL (ref 1.4–7.7)
Neutrophils Relative %: 68.9 % (ref 43.0–77.0)
Platelets: 200 10*3/uL (ref 150.0–400.0)
RBC: 4.65 Mil/uL (ref 3.87–5.11)
RDW: 14.3 % (ref 11.5–15.5)
WBC: 6.1 10*3/uL (ref 4.0–10.5)

## 2023-11-08 LAB — HEPATIC FUNCTION PANEL
ALT: 24 U/L (ref 0–35)
AST: 21 U/L (ref 0–37)
Albumin: 4.4 g/dL (ref 3.5–5.2)
Alkaline Phosphatase: 79 U/L (ref 39–117)
Bilirubin, Direct: 0.1 mg/dL (ref 0.0–0.3)
Total Bilirubin: 0.7 mg/dL (ref 0.2–1.2)
Total Protein: 7 g/dL (ref 6.0–8.3)

## 2023-11-08 LAB — BASIC METABOLIC PANEL WITH GFR
BUN: 17 mg/dL (ref 6–23)
CO2: 24 meq/L (ref 19–32)
Calcium: 9.1 mg/dL (ref 8.4–10.5)
Chloride: 102 meq/L (ref 96–112)
Creatinine, Ser: 0.61 mg/dL (ref 0.40–1.20)
GFR: 95.4 mL/min (ref >60.00)
Glucose, Bld: 95 mg/dL (ref 70–99)
Potassium: 4 meq/L (ref 3.5–5.1)
Sodium: 136 meq/L (ref 135–145)

## 2023-11-08 LAB — HEMOGLOBIN A1C: Hgb A1c MFr Bld: 5.4 % (ref 4.6–6.5)

## 2023-11-08 LAB — T4, FREE: Free T4: 0.91 ng/dL (ref 0.60–1.60)

## 2023-11-08 LAB — T3, FREE: T3, Free: 3.4 pg/mL (ref 2.3–4.2)

## 2023-11-08 LAB — TSH: TSH: 0.61 u[IU]/mL (ref 0.35–5.50)

## 2023-11-09 ENCOUNTER — Encounter: Payer: Self-pay | Admitting: *Deleted

## 2023-12-05 ENCOUNTER — Other Ambulatory Visit: Payer: Self-pay | Admitting: Family Medicine

## 2023-12-05 DIAGNOSIS — L578 Other skin changes due to chronic exposure to nonionizing radiation: Secondary | ICD-10-CM | POA: Diagnosis not present

## 2023-12-05 DIAGNOSIS — S80861A Insect bite (nonvenomous), right lower leg, initial encounter: Secondary | ICD-10-CM | POA: Diagnosis not present

## 2023-12-05 DIAGNOSIS — L219 Seborrheic dermatitis, unspecified: Secondary | ICD-10-CM | POA: Diagnosis not present

## 2023-12-05 DIAGNOSIS — D225 Melanocytic nevi of trunk: Secondary | ICD-10-CM | POA: Diagnosis not present

## 2024-02-14 ENCOUNTER — Encounter: Payer: Self-pay | Admitting: Family Medicine

## 2024-02-14 ENCOUNTER — Telehealth: Payer: Self-pay

## 2024-02-14 ENCOUNTER — Other Ambulatory Visit: Payer: Self-pay

## 2024-02-14 MED ORDER — COLCHICINE 0.6 MG PO CAPS: 1.0000 | ORAL_CAPSULE | ORAL | 0 refills | Status: DC | PRN

## 2024-02-14 NOTE — Telephone Encounter (Signed)
 Pharmacy Patient Advocate Encounter   Received notification from CoverMyMeds that prior authorization for Colchicine  0.6MG  capsules is required/requested.   Insurance verification completed.   The patient is insured through [employer] .   Per test claim: PA required; PA submitted to above mentioned insurance via CoverMyMeds Key/confirmation #/EOC [id number] Status is pending

## 2024-02-21 ENCOUNTER — Other Ambulatory Visit (HOSPITAL_COMMUNITY): Payer: Self-pay

## 2024-02-21 NOTE — Telephone Encounter (Signed)
 Pharmacy Patient Advocate Encounter  Received notification from [facility] that Prior Authorization for  Colchicine  0.6MG  capsules has been APPROVED from 01/15/2024 to 02/13/2025. Unable to obtain price due to refill too soon rejection, last fill date 02/18/2024 next available fill date 03/01/2024   PA #/Case ID/Reference #: [id number]

## 2024-03-12 ENCOUNTER — Encounter: Payer: Self-pay | Admitting: Family Medicine

## 2024-03-12 ENCOUNTER — Ambulatory Visit: Admitting: Family Medicine

## 2024-03-12 VITALS — BP 118/80 | HR 59 | Temp 98.2°F | Wt 183.0 lb

## 2024-03-12 DIAGNOSIS — J019 Acute sinusitis, unspecified: Secondary | ICD-10-CM | POA: Diagnosis not present

## 2024-03-12 MED ORDER — AZITHROMYCIN 250 MG PO TABS: ORAL_TABLET | ORAL | 0 refills | Status: AC

## 2024-03-12 NOTE — Progress Notes (Signed)
   Subjective:    Patient ID: Tara Schroeder, female    DOB: July 13, 1961, 63 y.o.   MRN: [id number]  HPI Here for one week of sinus congestion, PND, and ST. No fever or cough. Taking Allegra.    Review of Systems  Constitutional: Negative.   HENT:  Positive for congestion, postnasal drip, sinus pressure and sore throat. Negative for ear pain.   Eyes: Negative.   Respiratory: Negative.         Objective:   Physical Exam Constitutional:      Appearance: Normal appearance.  HENT:     Right Ear: Tympanic membrane, ear canal and external ear normal.     Left Ear: Tympanic membrane, ear canal and external ear normal.     Nose: Nose normal.     Mouth/Throat:     Pharynx: Oropharynx is clear.  Eyes:     Conjunctiva/sclera: Conjunctivae normal.  Pulmonary:     Effort: Pulmonary effort is normal.     Breath sounds: Normal breath sounds.  Lymphadenopathy:     Cervical: No cervical adenopathy.  Neurological:     Mental Status: She is alert.           Assessment & Plan:  Sinusitis, treat with a Zpack.  Garnette Olmsted, MD

## 2024-03-13 ENCOUNTER — Other Ambulatory Visit: Payer: Self-pay | Admitting: Family Medicine

## 2024-04-08 DIAGNOSIS — M25562 Pain in left knee: Secondary | ICD-10-CM | POA: Diagnosis not present

## 2024-04-15 DIAGNOSIS — M1812 Unilateral primary osteoarthritis of first carpometacarpal joint, left hand: Secondary | ICD-10-CM | POA: Diagnosis not present

## 2024-07-17 DIAGNOSIS — Z13820 Encounter for screening for osteoporosis: Secondary | ICD-10-CM | POA: Diagnosis not present

## 2024-07-17 DIAGNOSIS — Z6830 Body mass index (BMI) 30.0-30.9, adult: Secondary | ICD-10-CM | POA: Diagnosis not present

## 2024-07-17 DIAGNOSIS — Z01419 Encounter for gynecological examination (general) (routine) without abnormal findings: Secondary | ICD-10-CM | POA: Diagnosis not present

## 2024-07-17 DIAGNOSIS — Z1231 Encounter for screening mammogram for malignant neoplasm of breast: Secondary | ICD-10-CM | POA: Diagnosis not present
